# Patient Record
Sex: Male | Born: 1960
Health system: Southern US, Community
[De-identification: ages and names within clinical notes are randomized; demographics above are authoritative.]

## PROBLEM LIST (undated history)

## (undated) DIAGNOSIS — D689 Coagulation defect, unspecified: Secondary | ICD-10-CM

## (undated) DIAGNOSIS — D573 Sickle-cell trait: Secondary | ICD-10-CM

## (undated) DIAGNOSIS — I82409 Acute embolism and thrombosis of unspecified deep veins of unspecified lower extremity: Secondary | ICD-10-CM

## (undated) DIAGNOSIS — E785 Hyperlipidemia, unspecified: Secondary | ICD-10-CM

## (undated) HISTORY — DX: Acute embolism and thrombosis of unspecified deep veins of unspecified lower extremity: I82.409

## (undated) HISTORY — DX: Coagulation defect, unspecified: D68.9

## (undated) HISTORY — PX: COLONOSCOPY: SHX174

## (undated) HISTORY — DX: Hyperlipidemia, unspecified: E78.5

## (undated) HISTORY — DX: Sickle-cell trait: D57.3

## (undated) HISTORY — PX: HERNIA REPAIR: SHX51

## (undated) HISTORY — PX: KNEE SURGERY: SHX244

---

## 2011-02-03 ENCOUNTER — Ambulatory Visit (INDEPENDENT_AMBULATORY_CARE_PROVIDER_SITE_OTHER): Payer: Federal, State, Local not specified - PPO

## 2011-02-03 DIAGNOSIS — M545 Low back pain, unspecified: Secondary | ICD-10-CM

## 2011-02-03 DIAGNOSIS — I10 Essential (primary) hypertension: Secondary | ICD-10-CM

## 2011-03-25 ENCOUNTER — Ambulatory Visit: Payer: Federal, State, Local not specified - PPO

## 2011-03-25 ENCOUNTER — Ambulatory Visit (INDEPENDENT_AMBULATORY_CARE_PROVIDER_SITE_OTHER): Payer: Federal, State, Local not specified - PPO | Admitting: Family Medicine

## 2011-03-25 VITALS — BP 121/74 | HR 73 | Temp 97.4°F | Resp 16 | Ht 70.5 in | Wt 191.6 lb

## 2011-03-25 DIAGNOSIS — IMO0002 Reserved for concepts with insufficient information to code with codable children: Secondary | ICD-10-CM

## 2011-03-25 DIAGNOSIS — M79609 Pain in unspecified limb: Secondary | ICD-10-CM

## 2011-03-25 NOTE — Progress Notes (Signed)
  Patient Name: Ryan Gutierrez Date of Birth: 08/25/1960 Medical Record Number: [id number] Gender: male Date of Encounter: 03/25/2011  History of Present Illness:  Ryan Gutierrez is a 51 y.o. very pleasant male patient who presents with the following:  He has noted a pain in his left posterior thigh for the last month or so.  It hurts when he goes from sitting to standing and when he first starts walking- once he warms it up it feels better- feels bette after just a couple of steps.  Hurts most with prolonged sitting.  It only seems to hurt for a moment or two when he first stands or starts to walk.   He does have a history of DVT/ PE about 10 years ago and has had some swelling of his left leg ever since- this is not changed.  He is compliant with his coumadin and notes no CP/ SOB  There is no problem list on file for this patient.  No past medical history on file. No past surgical history on file. History  Substance Use Topics  . Smoking status: Never Smoker   . Smokeless tobacco: Not on file  . Alcohol Use: Not on file   No family history on file. No Known Allergies  Medication list has been reviewed and updated.  Review of Systems: As per HPI- otherwise negative. On coumadin due to distant history of DVT/PE  Physical Examination: Filed Vitals:   03/25/11 1640  BP: 121/74  Pulse: 73  Temp: 97.4 F (36.3 C)  TempSrc: Oral  Resp: 16  Height: 5' 10.5" (1.791 m)  Weight: 191 lb 9.6 oz (86.909 kg)    Body mass index is 27.10 kg/(m^2).   GEN: WDWN, NAD, Non-toxic, Alert & Oriented x 3 HEENT: Atraumatic, Normocephalic.  Ears and Nose: No external deformity. EXTR: No clubbing/cyanosis/edema NEURO: Normal gait.  PSYCH: Normally interactive. Conversant. Not depressed or anxious appearing.  Calm demeanor.  He notes that his left hamstring is the source of his sympotms. However, it is not hurting right now.  He has normal hip movement and ROM, normal back flexion.  No  swelling, tenderness or other problems with his hamstring that I can see at this time  [facility] reading (PRIMARY) by  Dr. Patsy Lager.  Negative femur.  ?calcified vessel in leg  Assessment and Plan: 1. Leg strain  DG Femur Left   I think Mynor has a hamstring strain- encouraged him to do stretches.  I do not think medications will be helpful as the discomfort only lasts for a moment.  I will await the overread report regarding the calcification in his leg- may need an ultrasound

## 2011-03-28 ENCOUNTER — Other Ambulatory Visit: Payer: Self-pay | Admitting: Family Medicine

## 2011-03-28 DIAGNOSIS — M79606 Pain in leg, unspecified: Secondary | ICD-10-CM

## 2011-06-20 LAB — POCT INR: INR: 2.3 — AB (ref <=1.1)

## 2011-10-20 ENCOUNTER — Ambulatory Visit: Payer: Federal, State, Local not specified - PPO

## 2011-10-20 ENCOUNTER — Ambulatory Visit (INDEPENDENT_AMBULATORY_CARE_PROVIDER_SITE_OTHER): Payer: Federal, State, Local not specified - PPO | Admitting: Family Medicine

## 2011-10-20 VITALS — BP 112/70 | HR 67 | Temp 97.7°F | Resp 16 | Ht 70.5 in | Wt 190.0 lb

## 2011-10-20 DIAGNOSIS — K5900 Constipation, unspecified: Secondary | ICD-10-CM

## 2011-10-20 DIAGNOSIS — Z0000 Encounter for general adult medical examination without abnormal findings: Secondary | ICD-10-CM

## 2011-10-20 DIAGNOSIS — K921 Melena: Secondary | ICD-10-CM

## 2011-10-20 LAB — POCT URINALYSIS DIPSTICK
Bilirubin, UA: NEGATIVE
Blood, UA: NEGATIVE
Glucose, UA: NEGATIVE
Ketones, UA: NEGATIVE
Leukocytes, UA: NEGATIVE
Nitrite, UA: NEGATIVE
Protein, UA: NEGATIVE
Spec Grav, UA: 1.015
Urobilinogen, UA: 0.2
pH, UA: 6

## 2011-10-20 LAB — LIPID PANEL
Cholesterol: 166 mg/dL (ref 0–200)
HDL: 39 mg/dL — ABNORMAL LOW (ref >39)
LDL Cholesterol: 107 mg/dL — ABNORMAL HIGH (ref 0–99)
Total CHOL/HDL Ratio: 4.3 Ratio
Triglycerides: 99 mg/dL (ref <150)
VLDL: 20 mg/dL (ref 0–40)

## 2011-10-20 LAB — COMPREHENSIVE METABOLIC PANEL
ALT: 15 U/L (ref 0–53)
AST: 20 U/L (ref 0–37)
Albumin: 4.1 g/dL (ref 3.5–5.2)
Alkaline Phosphatase: 75 U/L (ref 39–117)
BUN: 15 mg/dL (ref 6–23)
CO2: 32 mEq/L (ref 19–32)
Calcium: 9.3 mg/dL (ref 8.4–10.5)
Chloride: 104 mEq/L (ref 96–112)
Creat: 0.98 mg/dL (ref 0.50–1.35)
Glucose, Bld: 89 mg/dL (ref 70–99)
Potassium: 4.4 mEq/L (ref 3.5–5.3)
Sodium: 142 mEq/L (ref 135–145)
Total Bilirubin: 0.6 mg/dL (ref 0.3–1.2)
Total Protein: 7.7 g/dL (ref 6.0–8.3)

## 2011-10-20 LAB — POCT CBC
Granulocyte percent: 69.4 %G (ref 37–80)
HCT, POC: 48.3 % (ref 43.5–53.7)
Hemoglobin: 15 g/dL (ref 14.1–18.1)
Lymph, poc: 1.5 (ref 0.6–3.4)
MCH, POC: 27.9 pg (ref 27–31.2)
MCHC: 31.1 g/dL — AB (ref 31.8–35.4)
MCV: 90 fL (ref 80–97)
MID (cbc): 0.4 (ref 0–0.9)
MPV: 10.2 fL (ref 0–99.8)
POC Granulocyte: 4.4 (ref 2–6.9)
POC LYMPH PERCENT: 23.8 %L (ref 10–50)
POC MID %: 6.8 %M (ref 0–12)
Platelet Count, POC: 201 10*3/uL (ref 142–424)
RBC: 5.37 M/uL (ref 4.69–6.13)
RDW, POC: 14.8 %
WBC: 6.3 10*3/uL (ref 4.6–10.2)

## 2011-10-20 LAB — IFOBT (OCCULT BLOOD): IFOBT: NEGATIVE

## 2011-10-20 LAB — PSA: PSA: 1.48 ng/mL (ref <=4.00)

## 2011-10-20 NOTE — Progress Notes (Signed)
@[email or web address] @  Patient ID: Ryan Gutierrez MRN: [id number], DOB: [birth year] 51 y.o. Date of Encounter: 10/20/2011, 8:52 AM  Primary Physician: No primary provider on file.  Chief Complaint: Physical (CPE)  HPI: 51 y.o. y/o male with history noted below here for CPE.  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 He does ago for medical for this.  The last 6 months he's had some problems with constipation and blood in his stool.  Review of Systems: Consitutional: No fever, chills, fatigue, night sweats, lymphadenopathy, or weight changes. Eyes: No visual changes, eye redness, or discharge. ENT/Mouth: Ears: No otalgia, tinnitus, hearing loss, discharge. Nose: No congestion, rhinorrhea, sinus pain, or epistaxis. Throat: No sore throat, post nasal drip, or teeth pain. Cardiovascular: No CP, palpitations, diaphoresis, DOE, edema, orthopnea, PND. Respiratory: No cough, hemoptysis, SOB, or wheezing. Gastrointestinal: No anorexia, dysphagia, reflux, pain, nausea, vomiting, hematemesis, diarrhea, or melena. Genitourinary: No dysuria, frequency, urgency, hematuria, incontinence, nocturia, decreased urinary stream, discharge, impotence, or testicular pain/masses. Musculoskeletal: No decreased ROM, myalgias, stiffness, joint swelling, or weakness. Skin: No rash, erythema, lesion changes, pain, warmth, jaundice, or pruritis. Neurological: No headache, dizziness, syncope, seizures, tremors, memory loss, coordination problems, or paresthesias. Psychological: No anxiety, depression, hallucinations, SI/HI. Endocrine: No fatigue, polydipsia, polyphagia, polyuria, or known diabetes. All other systems were reviewed and are otherwise negative.  Past Medical History  Diagnosis Date  . Clotting disorder   . Hyperlipidemia      Past Surgical History  Procedure Date  . Hernia repair     Home Meds:  Prior to Admission medications     Medication Sig Start Date End Date Taking? Authorizing Provider  pravastatin (PRAVACHOL) 40 MG tablet Take 40 mg by mouth daily.   Yes Historical Provider, MD  warfarin (COUMADIN) 5 MG tablet Take 5 mg by mouth daily. Takes 5 mg Mon, Tues Thurs, Fri Sat. Takes 7.5 on Wed & Sun   Yes Historical Provider, MD    Allergies: No Known Allergies  History   Social History  . Marital Status: Single    Spouse Name: N/A    Number of Children: N/A  . Years of Education: N/A   Occupational History  . Not on file.   Social History Main Topics  . Smoking status: Never Smoker   . Smokeless tobacco: Not on file  . Alcohol Use: Not on file  . Drug Use: Not on file  . Sexually Active: Not on file   Other Topics Concern  . Not on file   Social History Narrative  . No narrative on file    Family History  Problem Relation Age of Onset  . Heart disease Mother     Physical Exam: Blood pressure 112/70, pulse 67, temperature 97.7 F (36.5 C), temperature source Oral, resp. rate 16, height 5' 10.5" (1.791 m), weight 190 lb (86.183 kg), SpO2 97.00%.  General: Well developed, well nourished, in no acute distress. HEENT: Normocephalic, atraumatic. Conjunctiva pink, sclera non-icteric. Pupils 2 mm constricting to 1 mm, round, regular, and equally reactive to light and accomodation. EOMI. Internal auditory canal clear. TMs with good cone of light and without pathology. Nasal mucosa pink. Nares are without discharge. No sinus tenderness. Oral mucosa pink. Dentition very poor. Pharynx without exudate.   Neck: Supple. Trachea midline. No thyromegaly. Full ROM. No lymphadenopathy. Lungs: Clear to auscultation bilaterally without wheezes, rales, or rhonchi. Breathing is of normal effort and unlabored. Cardiovascular: RRR with S1  S2. No murmurs, rubs, or gallops appreciated. Distal pulses 2+ symmetrically. No carotid or abdominal bruits Abdomen: Soft, non-tender, non-distended with normoactive bowel sounds.  No hepatosplenomegaly or masses. No rebound/guarding. No CVA tenderness. Without hernias.  Rectal: No external hemorrhoids or fissures. Rectal vault without masses.  Genitourinary:  circumcised male. No penile lesions. Testes descended bilaterally, and smooth without tenderness or masses.  Musculoskeletal: Full range of motion and 5/5 strength throughout. Without swelling, atrophy, tenderness, crepitus, or warmth. Extremities without clubbing, cyanosis, or edema. Calves supple. Skin: Warm and moist without erythema, ecchymosis, wounds, or rash. Neuro: A+Ox3. CN II-XII grossly intact. Moves all extremities spontaneously. Full sensation throughout. Normal gait. DTR 2+ throughout upper and lower extremities. Finger to nose intact. Psych:  Responds to questions appropriately with a normal affect.   Studies: CBC, CMET, Lipid, PSA, TSH all pending. Patient is  UA:   Assessment/Plan:  51 y.o. y/o  male here for CPE -[facility] reading (PRIMARY) by  Dr. Milus Glazier normal CXR Results for orders placed in visit on 10/20/11  POCT CBC      Component Value Range   WBC 6.3  4.6 - 10.2 K/uL   Lymph, poc 1.5  0.6 - 3.4   POC LYMPH PERCENT 23.8  10 - 50 %L   MID (cbc) 0.4  0 - 0.9   POC MID % 6.8  0 - 12 %M   POC Granulocyte 4.4  2 - 6.9   Granulocyte percent 69.4  37 - 80 %G   RBC 5.37  4.69 - 6.13 M/uL   Hemoglobin 15.0  14.1 - 18.1 g/dL   HCT, POC 16.1  09.6 - 53.7 %   MCV 90.0  80 - 97 fL   MCH, POC 27.9  27 - 31.2 pg   MCHC 31.1 (*) 31.8 - 35.4 g/dL   RDW, POC 04.5     Platelet Count, POC 201  142 - 424 K/uL   MPV 10.2  0 - 99.8 fL  IFOBT (OCCULT BLOOD)      Component Value Range   IFOBT Negative    POCT URINALYSIS DIPSTICK      Component Value Range   Color, UA yellow     Clarity, UA clear     Glucose, UA neg     Bilirubin, UA neg     Ketones, UA neg     Spec Grav, UA 1.015     Blood, UA neg     pH, UA 6.0     Protein, UA neg     Urobilinogen, UA 0.2     Nitrite, UA neg      Leukocytes, UA Negative     1. Health care maintenance  Ambulatory referral to Gastroenterology, POCT CBC, IFOBT POC (occult bld, rslt in office), Comprehensive metabolic panel, Lipid panel, PSA, DG Chest 2 View, EKG 12-Lead, POCT urinalysis dipstick  2. Hematochezia  Ambulatory referral to Gastroenterology, POCT CBC, IFOBT POC (occult bld, rslt in office), Comprehensive metabolic panel, Lipid panel, PSA, DG Chest 2 View, EKG 12-Lead  3. Constipation  Ambulatory referral to Gastroenterology, POCT CBC, IFOBT POC (occult bld, rslt in office), Comprehensive metabolic panel, Lipid panel, PSA, DG Chest 2 View, EKG 12-Lead   .    Signed, Elvina Sidle, MD 10/20/2011 8:52 AM

## 2011-10-24 ENCOUNTER — Encounter: Payer: Self-pay | Admitting: *Deleted

## 2011-10-24 DIAGNOSIS — I82409 Acute embolism and thrombosis of unspecified deep veins of unspecified lower extremity: Secondary | ICD-10-CM | POA: Insufficient documentation

## 2011-10-27 ENCOUNTER — Other Ambulatory Visit: Payer: Self-pay | Admitting: Family Medicine

## 2011-11-05 ENCOUNTER — Encounter: Payer: Self-pay | Admitting: *Deleted

## 2011-11-05 DIAGNOSIS — K921 Melena: Secondary | ICD-10-CM | POA: Insufficient documentation

## 2012-01-26 ENCOUNTER — Other Ambulatory Visit: Payer: Self-pay | Admitting: Physician Assistant

## 2012-04-27 ENCOUNTER — Other Ambulatory Visit: Payer: Self-pay | Admitting: Physician Assistant

## 2012-05-27 ENCOUNTER — Other Ambulatory Visit: Payer: Self-pay | Admitting: Physician Assistant

## 2012-06-24 ENCOUNTER — Other Ambulatory Visit: Payer: Self-pay | Admitting: Family Medicine

## 2012-07-23 ENCOUNTER — Other Ambulatory Visit: Payer: Self-pay | Admitting: Physician Assistant

## 2013-02-15 ENCOUNTER — Encounter: Payer: Self-pay | Admitting: Family Medicine

## 2013-03-23 ENCOUNTER — Encounter: Payer: Self-pay | Admitting: Family Medicine

## 2013-05-26 ENCOUNTER — Ambulatory Visit (INDEPENDENT_AMBULATORY_CARE_PROVIDER_SITE_OTHER): Payer: Federal, State, Local not specified - PPO | Admitting: Emergency Medicine

## 2013-05-26 VITALS — BP 108/64 | HR 59 | Temp 97.6°F | Resp 14 | Ht 70.5 in | Wt 189.8 lb

## 2013-05-26 DIAGNOSIS — E782 Mixed hyperlipidemia: Secondary | ICD-10-CM

## 2013-05-26 DIAGNOSIS — Z0000 Encounter for general adult medical examination without abnormal findings: Secondary | ICD-10-CM

## 2013-05-26 DIAGNOSIS — Z7901 Long term (current) use of anticoagulants: Secondary | ICD-10-CM

## 2013-05-26 DIAGNOSIS — I2699 Other pulmonary embolism without acute cor pulmonale: Secondary | ICD-10-CM

## 2013-05-26 LAB — LIPID PANEL
Cholesterol: 267 mg/dL — ABNORMAL HIGH (ref 0–200)
HDL: 42 mg/dL (ref >39)
LDL CALC: 203 mg/dL — AB (ref 0–99)
Total CHOL/HDL Ratio: 6.4 Ratio
Triglycerides: 110 mg/dL (ref <150)
VLDL: 22 mg/dL (ref 0–40)

## 2013-05-26 LAB — POCT UA - MICROSCOPIC ONLY
CRYSTALS, UR, HPF, POC: NEGATIVE
Casts, Ur, LPF, POC: NEGATIVE
Mucus, UA: NEGATIVE
YEAST UA: NEGATIVE

## 2013-05-26 LAB — POCT URINALYSIS DIPSTICK
Bilirubin, UA: NEGATIVE
GLUCOSE UA: NEGATIVE
Ketones, UA: NEGATIVE
NITRITE UA: NEGATIVE
Protein, UA: NEGATIVE
Spec Grav, UA: 1.02
Urobilinogen, UA: 1
pH, UA: 7

## 2013-05-26 LAB — POCT CBC
Granulocyte percent: 66.1 %G (ref 37–80)
HCT, POC: 48.1 % (ref 43.5–53.7)
Hemoglobin: 15.1 g/dL (ref 14.1–18.1)
Lymph, poc: 1.4 (ref 0.6–3.4)
MCH: 28 pg (ref 27–31.2)
MCHC: 31.4 g/dL — AB (ref 31.8–35.4)
MCV: 89.3 fL (ref 80–97)
MID (CBC): 0.4 (ref 0–0.9)
MPV: 11.2 fL (ref 0–99.8)
PLATELET COUNT, POC: 203 10*3/uL (ref 142–424)
POC Granulocyte: 3.4 (ref 2–6.9)
POC LYMPH %: 27.1 % (ref 10–50)
POC MID %: 6.8 % (ref 0–12)
RBC: 5.39 M/uL (ref 4.69–6.13)
RDW, POC: 14.7 %
WBC: 5.2 10*3/uL (ref 4.6–10.2)

## 2013-05-26 LAB — COMPREHENSIVE METABOLIC PANEL
ALBUMIN: 4.6 g/dL (ref 3.5–5.2)
ALK PHOS: 93 U/L (ref 39–117)
ALT: 16 U/L (ref 0–53)
AST: 21 U/L (ref 0–37)
BUN: 16 mg/dL (ref 6–23)
CO2: 29 mEq/L (ref 19–32)
Calcium: 9.5 mg/dL (ref 8.4–10.5)
Chloride: 101 mEq/L (ref 96–112)
Creat: 1.01 mg/dL (ref 0.50–1.35)
Glucose, Bld: 95 mg/dL (ref 70–99)
POTASSIUM: 4.3 meq/L (ref 3.5–5.3)
SODIUM: 139 meq/L (ref 135–145)
TOTAL PROTEIN: 8.3 g/dL (ref 6.0–8.3)
Total Bilirubin: 0.6 mg/dL (ref 0.2–1.2)

## 2013-05-26 NOTE — Patient Instructions (Signed)
Warfarin: What You Need to Know  Warfarin is an anticoagulant. Anticoagulants help prevent the formation of blood clots. They also help stop the growth of blood clots. Warfarin is sometimes referred to as a "blood thinner."   Normally, when body tissues are cut or damaged, the blood clots in order to prevent blood loss. Sometimes clots form inside your blood vessels and obstruct the flow of blood through your circulatory system (thrombosis). These clots may travel through your bloodstream and become lodged in smaller blood vessels in your brain, which can cause a stroke, or your lungs (pulmonary embolism).  WHO SHOULD USE WARFARIN?  Warfarin is prescribed for people at risk of developing harmful blood clots:  · People with surgically implanted mechanical heart valves, irregular heart rhythms called atrial fibrillation, and certain clotting disorders.  · People who have developed harmful blood clotting in the past, including those who have had a stroke or a pulmonary embolism, or thrombosis in their legs (deep vein thrombosis [DVT]).  · People with an existing blood clot such as a pulmonary embolism.  WARFARIN DOSING  Warfarin tablets come in different strengths. Each tablet strength is a different color, with the amount of warfarin (in milligrams) clearly printed on the tablet. If the color of your tablet is different than usual when you receive a new prescription, report it immediately to your pharmacist or health care provider.  WARFARIN MONITORING  The goal of warfarin therapy is to lessen the clotting tendency of blood but not to prevent clotting completely. Your health care provider will monitor the anticoagulation effect of warfarin closely and adjust your dose as needed. For your safety, blood tests called prothrombin time (PT) or international normalized ratio (INR) are used to measure the effects of warfarin. Both of these tests can be done with a finger stick or a blood draw.  The longer it takes the blood  to clot, the higher the PT or INR. Your health care provider will inform you of your "target" PT or INR range. If, at any time, your PT or INR is above the target range, there is a risk of bleeding. If your PT or INR is below the target range, there is a risk of clotting.  Whether you are started on warfarin while you are in the hospital, or in your health care provider's office, you will need to have your PT or INR checked within one week of starting the medicine. Initially, some people are asked to have their PT or INR checked as much as twice a week.  Once you are on a stable maintenance dose, the PT or INR is checked less often, usually once every 2 to 4 weeks. The warfarin dose may be adjusted if the PT or INR is not within the target range. It is important to keep all laboratory and health care provider follow-up appointments.   WHAT ARE THE SIDE EFFECTS OF WARFARIN?  · Too much warfarin can cause bleeding (hemorrhage) from any part of the body. This may include bleeding from the gums, blood in the urine, bloody or dark stools, a nosebleed that is not easily stopped, coughing up blood, or vomiting blood.  · Too little warfarin can increase the risk of blood clots.  · Too little or too much warfarin can also increase the risk of a stroke.  · Warfarin use may cause a skin rash or irritation, an unusual fever, continual nausea or stomach upset, or severe pain in your joints or back.  SPECIAL PRECAUTIONS   WHILE TAKING WARFARIN  Warfarin should be taken exactly as directed:  · Take your medicine at the same time every day. If you forget to take your dose, you can take it if it is within 6 hours of when it was due.  · Do not change the dose of warfarin on your own to make up for missed or extra doses.  · If you miss more than 2 doses in a row, you should contact your health care provider for advice.  Avoid situations that cause bleeding. You may have a tendency to bleed more easily than usual while taking warfarin.  The following actions can limit bleeding:  · Using a softer toothbrush.  · Flossing with waxed floss rather than unwaxed floss.  · Shaving with an electric razor rather than a blade.  · Limiting the use of sharp objects.  · Avoiding potentially harmful activities such as contact sports.  Warfarin and Pregnancy or Breastfeeding  · Warfarin is not advised during the first trimester of pregnancy due to an increased risk of birth defects. In certain situations, a woman may take warfarin after her first trimester of pregnancy. A woman who becomes pregnant or plans to become pregnant while taking warfarin should notify her health care provider immediately.  · Although warfarin does not pass into breast milk, a woman who wishes to breastfeed while taking warfarin should also consult with her health care provider.  Alcohol, Smoking, and Illicit Drug Use  · Alcohol affects how warfarin works in the body. It is best to avoid alcoholic drinks or consume very small amounts while taking warfarin. In general, alcohol intake should be limited to 1 oz (30 mL) of liquor, 6 oz (180 mL) of wine, or 12 oz (360 mL) of beer each day. Notify your health care provider if you change your alcohol intake.  · Smoking affects how warfarin works. It is best to avoid smoking while taking warfarin. Notify your health care provider if you change your smoking habits.  · It is best to avoid all illicit drugs while taking warfarin since there are few studies that show how warfarin interacts with these drugs.  Other Medicines and Dietary Supplements  Many prescription and over-the-counter medicines can interfere with warfarin. Be sure all of your health care providers know you are taking warfarin. Notify your health care provider who prescribed warfarin for you before starting or stopping any new medicines, including over-the-counter vitamins, dietary supplements, and pain medicines. Your warfarin dose may need to be adjusted.  Some common  over-the-counter medicines that may increase the risk of bleeding while taking warfarin include:   · Acetaminophen.  · Aspirin.  · Nonsteroidal anti-inflammatory medicines such as ibuprofen or naproxen.  · Vitamin E.  Dietary Considerations   Foods that have moderate or high amounts of vitamin K can interfere with warfarin. Avoid major changes in your diet or notify your health care provider before changing your diet. Eat a consistent amount of foods that have moderate or high amounts of vitamin K.Eating less foods containing vitamin K can increase the risk of bleeding. Eating more foods containing vitamin K can increase the risk of blood clots. Additional questions about dietary considerations can be discussed with a dietitian.  The serving size for foods containing moderate or high amounts of vitamin K are ½ cup cooked (120 mL or noted gram weight) or 1 cup raw (240 mL or noted gram weight), unless otherwise noted. These foods include:  Proteins  · Beef liver,   3.5 oz (100 g).  · Pork liver, 3.5 oz (100 g).  Legumes  · Soybean oil.  · Soybeans.  · Garbanzo beans.  · Green peas.  · Black-eyed peas.  Leafy green vegetables  · Kale.  · Spinach.  · Nettle greens.  · Swiss chard.  · Watercress.  · Endive.  · Parsley, 1 tbsp (4 g).  · Turnip greens.  · Collard greens.  · Seaweed, limit 2 sheets.  · Beet greens.  · Dandelion greens.  · Mustard greens.  · Green Lead and Romaine lettuce.  Cruciferous vegetables  · Broccoli.  · Cabbage (green or Chinese).  · Brussels sprouts.  · Cauliflower.  · Asparagus.  Miscellaneous  · Onions, green onions, or spring onions.  · Green tea made with ½ oz (14 g) or more of dried tea.  · Herbal teas containing coumarin.  · Spinach noodles.  · Okra.  · Prunes.  · Pickles.  CALL YOUR CLINIC OR HEALTH CARE PROVIDER IF YOU:  · Plan to have any surgery or procedure.  · Feel sick, especially if you have diarrhea or vomiting.  · Experience or anticipate any major changes in your diet.  · Start or  stop a prescription or over-the-counter medicine.  · Become, plan to become, or think you may be pregnant.  · Are having heavier than usual menstrual periods.  · Have had a fall, accident, or any symptoms of bleeding or unusual bruising.  · An unusual fever.  CALL 911 IN THE [place] OR GO TO THE EMERGENCY DEPARTMENT IF YOU:   · Think you may be having an allergic reaction to warfarin. The signs of an allergic reaction could include itching, rash, hives, swelling, chest tightness, or trouble breathing.  · See signs of blood in your urine. The signs could include reddish, pinkish, or tea-colored urine.  · See signs of blood in your stools. The signs could include bright red or black stools.  · Vomit or cough up blood. In these instances, the blood could have either a bright red or a "coffee-grounds" appearance.  · Have bleeding that will not stop after applying pressure for 30 minutes such as cuts, nosebleeds, other injuries.  · Have severe pain in your joints or back.  · Have a new and severe headache.  · Have sudden weakness or numbness of your face, arm, or leg, especially on one side of your body.  · Have sudden confusion or trouble understanding.  · Have sudden trouble seeing in one or both eyes.  · Have sudden trouble walking, dizziness, loss of balance, or coordination.  · Have aphasia.  Document Released: 12/23/2004 Document Revised: 09/17/2011 Document Reviewed: 06/18/2012  Exit[employer]® Patient Information ©2014 [employer].

## 2013-05-26 NOTE — Progress Notes (Signed)
[facility] [address], [place] [address] [phone]  Date:  05/26/2013   Name:  Ryan Gutierrez   DOB:  March 20, 1960   MRN:  [id number]  PCP:  No PCP Per Patient    Chief Complaint: Annual Exam   History of Present Illness:  Ryan Saanthony Strickling is a 53 y.o. very pleasant male patient who presents with the following:  For annual physical.  Has been out of his pravachol.  Has been taking coumadin for one year without lab or medical supervision.  Denies any bleeding but bruises from time to time.  Underwent colonoscopy in January this year.  No current medical complaints.    Patient Active Problem List   Diagnosis Date Noted  . Mixed hyperlipidemia 05/26/2013  . Hematochezia 11/05/2011  . Recurrent deep vein thrombosis 10/24/2011    Past Medical History  Diagnosis Date  . Clotting disorder   . Hyperlipidemia     Past Surgical History  Procedure Laterality Date  . Hernia repair      History  Substance Use Topics  . Smoking status: Never Smoker   . Smokeless tobacco: Not on file  . Alcohol Use: Not on file    Family History  Problem Relation Age of Onset  . Heart disease Mother     No Known Allergies  Medication list has been reviewed and updated.  Current Outpatient Prescriptions on File Prior to Visit  Medication Sig Dispense Refill  . warfarin (COUMADIN) 5 MG tablet Take 5 mg by mouth daily. Takes 5 mg Sun, Mon, Tues Thurs, Fri, Sat. Takes 7.5 on Wed      . pravastatin (PRAVACHOL) 40 MG tablet TAKE 1 TABLET BY MOUTH EVERY DAY  30 tablet  0   No current facility-administered medications on file prior to visit.    Review of Systems:  As per HPI, otherwise negative.    Physical Examination: Filed Vitals:   05/26/13 1040  BP: 108/64  Pulse: 59  Temp: 97.6 F (36.4 C)  Resp: 14   Filed Vitals:   05/26/13 1040  Height: 5' 10.5" (1.791 m)  Weight: 189 lb 12.8 oz (86.093 kg)   Body mass index is 26.84 kg/(m^2). Ideal Body  Weight: Weight in (lb) to have BMI = 25: 176.4  GEN: WDWN, NAD, Non-toxic, A & O x 3 HEENT: Atraumatic, Normocephalic. Neck supple. No masses, No LAD. Ears and Nose: No external deformity. CV: RRR, No M/G/R. No JVD. No thrill. No extra heart sounds. PULM: CTA B, no wheezes, crackles, rhonchi. No retractions. No resp. distress. No accessory muscle use. ABD: S, NT, ND, +BS. No rebound. No HSM. EXTR: No c/c/e NEURO Normal gait.  PSYCH: Normally interactive. Conversant. Not depressed or anxious appearing.  Calm demeanor.    Assessment and Plan: Wellness examination Noncompliance Follow up on labs   Signed,  Phillips OdorJeffery Khris Jansson, MD   Results for orders placed in visit on 05/26/13  POCT CBC      Result Value Ref Range   WBC 5.2  4.6 - 10.2 K/uL   Lymph, poc 1.4  0.6 - 3.4   POC LYMPH PERCENT 27.1  10 - 50 %L   MID (cbc) 0.4  0 - 0.9   POC MID % 6.8  0 - 12 %M   POC Granulocyte 3.4  2 - 6.9   Granulocyte percent 66.1  37 - 80 %G   RBC 5.39  4.69 - 6.13 M/uL   Hemoglobin 15.1  14.1 -  18.1 g/dL   HCT, POC 04.548.1  40.943.5 - 53.7 %   MCV 89.3  80 - 97 fL   MCH, POC 28.0  27 - 31.2 pg   MCHC 31.4 (*) 31.8 - 35.4 g/dL   RDW, POC 81.114.7     Platelet Count, POC 203  142 - 424 K/uL   MPV 11.2  0 - 99.8 fL  POCT UA - MICROSCOPIC ONLY      Result Value Ref Range   WBC, Ur, HPF, POC 1-6     RBC, urine, microscopic 0-2     Bacteria, U Microscopic trace     Mucus, UA neg     Epithelial cells, urine per micros 0-3     Crystals, Ur, HPF, POC neg     Casts, Ur, LPF, POC neg     Yeast, UA neg    POCT URINALYSIS DIPSTICK      Result Value Ref Range   Color, UA yellow     Clarity, UA clear     Glucose, UA neg     Bilirubin, UA neg     Ketones, UA neg     Spec Grav, UA 1.020     Blood, UA trace     pH, UA 7.0     Protein, UA neg     Urobilinogen, UA 1.0     Nitrite, UA neg     Leukocytes, UA small (1+)

## 2013-05-27 LAB — PSA: PSA: 1.38 ng/mL (ref <=4.00)

## 2013-05-27 LAB — PROTIME-INR
INR: 1.95 — AB (ref <1.50)
Prothrombin Time: 21.8 seconds — ABNORMAL HIGH (ref 11.6–15.2)

## 2013-05-29 ENCOUNTER — Other Ambulatory Visit: Payer: Self-pay | Admitting: Emergency Medicine

## 2013-05-29 MED ORDER — PRAVASTATIN SODIUM 40 MG PO TABS: ORAL_TABLET | ORAL | Status: DC

## 2013-05-29 MED ORDER — ATORVASTATIN CALCIUM 40 MG PO TABS: 40.0000 mg | ORAL_TABLET | Freq: Every day | ORAL | Status: DC

## 2013-05-29 NOTE — Addendum Note (Signed)
Addended by: Carmelina Dane on: 05/29/2013 02:37 PM   Modules accepted: Orders, Medications

## 2013-05-29 NOTE — Addendum Note (Signed)
Addended by: Carmelina Dane on: 05/29/2013 02:28 PM   Modules accepted: Orders

## 2013-06-09 ENCOUNTER — Telehealth: Payer: Self-pay

## 2013-06-09 NOTE — Telephone Encounter (Signed)
Dr. Dareen Piano, we faxed a form to the place below and they need to know how long before his procedure on the 10th that he should stop the coumadin. Thanks   Magazine features editor at [place] [facility] [employer]  [phone]

## 2013-06-10 NOTE — Telephone Encounter (Signed)
I recommend a week

## 2013-06-11 NOTE — Telephone Encounter (Signed)
Spoke with pt. He says that he is going to stop it today and keep his appt on the 10th. Says that he is "just having a tooth pulled" and that he's had this done before with no problems from his coumadin.

## 2013-06-24 ENCOUNTER — Institutional Professional Consult (permissible substitution): Payer: Self-pay | Admitting: Cardiology

## 2013-07-12 ENCOUNTER — Ambulatory Visit (INDEPENDENT_AMBULATORY_CARE_PROVIDER_SITE_OTHER): Payer: Federal, State, Local not specified - PPO | Admitting: Internal Medicine

## 2013-07-12 ENCOUNTER — Ambulatory Visit (INDEPENDENT_AMBULATORY_CARE_PROVIDER_SITE_OTHER): Payer: Federal, State, Local not specified - PPO | Admitting: *Deleted

## 2013-07-12 ENCOUNTER — Encounter: Payer: Self-pay | Admitting: Internal Medicine

## 2013-07-12 VITALS — BP 130/84 | Ht 70.5 in | Wt 194.9 lb

## 2013-07-12 DIAGNOSIS — E782 Mixed hyperlipidemia: Secondary | ICD-10-CM

## 2013-07-12 DIAGNOSIS — Z7901 Long term (current) use of anticoagulants: Secondary | ICD-10-CM

## 2013-07-12 DIAGNOSIS — I2699 Other pulmonary embolism without acute cor pulmonale: Secondary | ICD-10-CM

## 2013-07-12 DIAGNOSIS — I82409 Acute embolism and thrombosis of unspecified deep veins of unspecified lower extremity: Secondary | ICD-10-CM

## 2013-07-12 LAB — POCT INR: INR: 2

## 2013-07-12 MED ORDER — RIVAROXABAN 20 MG PO TABS: 20.0000 mg | ORAL_TABLET | Freq: Every day | ORAL | Status: DC
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
## 2013-07-12 NOTE — Patient Instructions (Addendum)
STOP COUMADIN TODAY START Xarelto 20mg  once daily - take with supper.  ** Start this tomorrow.   Your physician wants you to follow-up in: 1 year with Dr. Rennis GoldenHilty. You will receive a reminder letter in the mail two months in advance. If you don't receive a letter, please call our office to schedule the follow-up appointment.

## 2013-11-28 ENCOUNTER — Telehealth: Payer: Self-pay

## 2013-11-28 NOTE — Telephone Encounter (Signed)
Patient needs refill of cholesterol medication. He says we cannot come in until Wednesday at the earliest

## 2013-11-29 ENCOUNTER — Other Ambulatory Visit: Payer: Self-pay | Admitting: Physician Assistant

## 2013-11-29 MED ORDER — ATORVASTATIN CALCIUM 40 MG PO TABS: 40.0000 mg | ORAL_TABLET | Freq: Every day | ORAL | Status: DC

## 2013-11-29 NOTE — Telephone Encounter (Signed)
Refill sent to the pharmacy.  Lm to advise pt.

## 2013-11-29 NOTE — Telephone Encounter (Signed)
Patient called back to return a call. Please advise.

## 2013-12-29 ENCOUNTER — Ambulatory Visit (INDEPENDENT_AMBULATORY_CARE_PROVIDER_SITE_OTHER): Payer: Federal, State, Local not specified - PPO | Admitting: Emergency Medicine

## 2013-12-29 VITALS — BP 120/72 | HR 60 | Temp 97.8°F | Resp 18 | Ht 71.0 in | Wt 205.0 lb

## 2013-12-29 DIAGNOSIS — E782 Mixed hyperlipidemia: Secondary | ICD-10-CM

## 2013-12-29 LAB — COMPREHENSIVE METABOLIC PANEL
ALBUMIN: 3.9 g/dL (ref 3.5–5.2)
ALK PHOS: 113 U/L (ref 39–117)
ALT: 20 U/L (ref 0–53)
AST: 21 U/L (ref 0–37)
BUN: 11 mg/dL (ref 6–23)
CO2: 27 mEq/L (ref 19–32)
CREATININE: 1 mg/dL (ref 0.50–1.35)
Calcium: 9.1 mg/dL (ref 8.4–10.5)
Chloride: 104 mEq/L (ref 96–112)
Glucose, Bld: 92 mg/dL (ref 70–99)
POTASSIUM: 4.2 meq/L (ref 3.5–5.3)
Sodium: 139 mEq/L (ref 135–145)
Total Bilirubin: 0.5 mg/dL (ref 0.2–1.2)
Total Protein: 7.2 g/dL (ref 6.0–8.3)

## 2013-12-29 LAB — LIPID PANEL
CHOL/HDL RATIO: 3 ratio
CHOLESTEROL: 116 mg/dL (ref 0–200)
HDL: 39 mg/dL — ABNORMAL LOW (ref >39)
LDL Cholesterol: 65 mg/dL (ref 0–99)
TRIGLYCERIDES: 61 mg/dL (ref <150)
VLDL: 12 mg/dL (ref 0–40)

## 2013-12-29 NOTE — Patient Instructions (Signed)

## 2013-12-29 NOTE — Progress Notes (Signed)
[facility] [address], [place] [address] [phone]  Date:  12/29/2013   Name:  Ryan Gutierrez   DOB:  [birth year]   MRN:  [id number]  PCP:  No PCP Per Patient    Chief Complaint: Hyperlipidemia   History of Present Illness:  Ryan Gutierrez is a 53 y.o. very pleasant male patient who presents with the following:  Started on xarelto for DVT due non compliance with coumadin Now here for labs to check lipids. No adverse symptoms related to medications. Denies other complaint or health concern today.   Patient Active Problem List   Diagnosis Date Noted  . Mixed hyperlipidemia 05/26/2013  . Other pulmonary embolism and infarction 05/26/2013  . Long term (current) use of anticoagulants 05/26/2013  . Hematochezia 11/05/2011  . Recurrent deep vein thrombosis 10/24/2011    Past Medical History  Diagnosis Date  . Clotting disorder   . Hyperlipidemia   . DVT (deep venous thrombosis)     post-knee surgery  . Sickle cell trait     Past Surgical History  Procedure Laterality Date  . Hernia repair    . Knee surgery  1990s    History  Substance Use Topics  . Smoking status: Never Smoker   . Smokeless tobacco: Never Used  . Alcohol Use: No    Family History  Problem Relation Age of Onset  . Heart disease Mother   . Heart attack Mother   . Diabetes Sister   . Hypertension Sister   . Sickle cell anemia Son     No Known Allergies  Medication list has been reviewed and updated.  Current Outpatient Prescriptions on File Prior to Visit  Medication Sig Dispense Refill  . atorvastatin (LIPITOR) 40 MG tablet Take 1 tablet (40 mg total) by mouth daily. 30 tablet 0  . rivaroxaban (XARELTO) 20 MG TABS tablet Take 1 tablet (20 mg total) by mouth daily with supper. 30 tablet 11   No current facility-administered medications on file prior to visit.    Review of Systems:  As per HPI, otherwise negative.    Physical Examination: Filed Vitals:    12/29/13 1021  BP: 120/72  Pulse: 60  Temp: 97.8 F (36.6 C)  Resp: 18   Filed Vitals:   12/29/13 1021  Height: 5\' 11"  (1.803 m)  Weight: 205 lb (92.987 kg)   Body mass index is 28.6 kg/(m^2). Ideal Body Weight: Weight in (lb) to have BMI = 25: 178.9   GEN: WDWN, NAD, Non-toxic, Alert & Oriented x 3 HEENT: Atraumatic, Normocephalic.  Ears and Nose: No external deformity. EXTR: No clubbing/cyanosis/edema NEURO: Normal gait.  PSYCH: Normally interactive. Conversant. Not depressed or anxious appearing.  Calm demeanor.    Assessment and Plan: Elevated lipids Long time treatment with xarelto for DVT   Signed,  Phillips OdorJeffery Ludell Zacarias, MD

## 2013-12-31 ENCOUNTER — Other Ambulatory Visit: Payer: Self-pay | Admitting: Physician Assistant

## 2014-05-06 ENCOUNTER — Ambulatory Visit (INDEPENDENT_AMBULATORY_CARE_PROVIDER_SITE_OTHER): Payer: Federal, State, Local not specified - PPO | Admitting: Physician Assistant

## 2014-05-06 VITALS — BP 110/70 | HR 66 | Temp 97.7°F | Ht 70.25 in | Wt 200.1 lb

## 2014-05-06 DIAGNOSIS — L02511 Cutaneous abscess of right hand: Secondary | ICD-10-CM | POA: Diagnosis not present

## 2014-05-06 DIAGNOSIS — M79644 Pain in right finger(s): Secondary | ICD-10-CM | POA: Diagnosis not present

## 2014-05-06 MED ORDER — DOXYCYCLINE HYCLATE 100 MG PO CAPS: 100.0000 mg | ORAL_CAPSULE | Freq: Two times a day (BID) | ORAL | Status: DC

## 2014-05-06 MED ORDER — TRAMADOL HCL 50 MG PO TABS: 50.0000 mg | ORAL_TABLET | Freq: Three times a day (TID) | ORAL | Status: DC | PRN

## 2014-05-06 NOTE — Progress Notes (Signed)
   Subjective:    Patient ID: Ryan Gutierrez, male    DOB: 18-Apr-1960, 54 y.o.   MRN: [id number]  HPI Patient presents for index finger of right hand pain that has been present for 5 days, however, he injured finger over 1 week ago. Initial wound healed days ago. Pain, however, has gotten worse and has noticed blood under skin. Pain does not radiate, but is now 9/10 on pain scale. Denies fever, loss of ROM/sensation/function, weakness, or numbness. NKDA.   Take Xarelto for DVT he had last year.    Review of Systems  Constitutional: Negative for fever and chills.  Skin: Positive for wound.  Neurological: Negative for weakness and numbness.       Objective:   Physical Exam  Constitutional: He is oriented to person, place, and time. He appears well-developed and well-nourished. No distress.  Blood pressure 110/70, pulse 66, temperature 97.7 F (36.5 C), temperature source Oral, height 5' 10.25" (1.784 m), weight 200 lb 2 oz (90.776 kg), SpO2 98 %.  HENT:  Head: Normocephalic and atraumatic.  Right Ear: External ear normal.  Left Ear: External ear normal.  Pulmonary/Chest: Effort normal.  Neurological: He is alert and oriented to person, place, and time.  Skin: Skin is warm, dry and intact. No rash noted. He is not diaphoretic. No erythema. No pallor.  Pus and blood under skin of finger.    Procedure Consent obtained. Metacarpal block with  4 cc of 1:1 1% lido and .5% marcaine. Iodine prep and sterile draping. Small incision made. Purulence expressed and culture obtained. Wound irrigated with 1 cc lido/marcaine. 1/4 plain packing placed. Clean dressing placed. Care instructions given.     Assessment & Plan:  1. Abscess of finger of right hand 2. Finger pain, right Warm compress 3-4x daily for 15-20 minutes. RTC 05/08/14 for wound care. - traMADol (ULTRAM) 50 MG tablet; Take 1 tablet (50 mg total) by mouth every 8 (eight) hours as needed.  Dispense: 30 tablet; Refill: 0 - doxycycline  (VIBRAMYCIN) 100 MG capsule; Take 1 capsule (100 mg total) by mouth 2 (two) times daily.  Dispense: 20 capsule; Refill: 0 - Wound culture     Janan Ridgeishira Drena Ham PA-C  [facility] 05/06/2014 1:57 PM

## 2014-05-06 NOTE — Progress Notes (Signed)
  Medical screening examination/treatment/procedure(s) were performed by non-physician practitioner and as supervising physician I was immediately available for consultation/collaboration.     

## 2014-05-08 ENCOUNTER — Encounter: Payer: Self-pay | Admitting: Physician Assistant

## 2014-05-08 ENCOUNTER — Ambulatory Visit (INDEPENDENT_AMBULATORY_CARE_PROVIDER_SITE_OTHER): Payer: Federal, State, Local not specified - PPO | Admitting: Physician Assistant

## 2014-05-08 VITALS — BP 114/70 | HR 65 | Temp 97.8°F | Resp 16 | Ht 70.5 in | Wt 201.0 lb

## 2014-05-08 DIAGNOSIS — M79644 Pain in right finger(s): Secondary | ICD-10-CM

## 2014-05-08 DIAGNOSIS — L02511 Cutaneous abscess of right hand: Secondary | ICD-10-CM

## 2014-05-08 LAB — WOUND CULTURE
Gram Stain: NONE SEEN
Gram Stain: NONE SEEN

## 2014-05-10 ENCOUNTER — Ambulatory Visit (INDEPENDENT_AMBULATORY_CARE_PROVIDER_SITE_OTHER): Payer: Federal, State, Local not specified - PPO | Admitting: Family Medicine

## 2014-05-10 VITALS — BP 120/84 | HR 70 | Temp 97.6°F | Resp 20 | Ht 70.5 in | Wt 204.0 lb

## 2014-05-10 DIAGNOSIS — S61209D Unspecified open wound of unspecified finger without damage to nail, subsequent encounter: Secondary | ICD-10-CM | POA: Diagnosis not present

## 2014-05-10 NOTE — Patient Instructions (Signed)
Soap and water to affected area twice per day but warm compresses at least 4-5 times per day.  I did not place packing again today as it appears ok. Change bandage after cleansing and if it becomes soaked - change bandage again.  Make sure to take your antibiotic twice per day. Recheck in 2 days with Ryan LennertSarah Gutierrez.   Return to the clinic or go to the nearest emergency room if any of your symptoms worsen or new symptoms occur.

## 2014-05-10 NOTE — Progress Notes (Signed)
Subjective:    Patient ID: Ryan Gutierrez, male    DOB: 12/11/1960, 54 y.o.   MRN: [id number]  HPI Chief Complaint  Patient presents with  . Dressing Change    Right hand, middle finger   This chart was scribed for Meredith StaggersJeffrey Haniyah Maciolek, MD by Andrew Auaven Small, ED Scribe. This patient was seen in room 14 and the patient's care was started at 4:55 PM.  HPI Comments: Ryan Gutierrez is a 54 y.o. male who [facility]. Pt was seen 4/30 by Tishira for abscess to right index finger.  I&D during that visit and pt was prescribed 50mg   Tramadol and doxycycline and was advised to follow up in approximately 2 days. Pt states he has been taking abx, which he is tolerating well, and applying a heat pack, 4 times a day, since being seen. He admits missing today's dose due to work schedule. Pt has not changed bandage since last visit. Pt denies fever and chills.     Patient Active Problem List   Diagnosis Date Noted  . Mixed hyperlipidemia 05/26/2013  . Other pulmonary embolism and infarction 05/26/2013  . Long term (current) use of anticoagulants 05/26/2013  . Hematochezia 11/05/2011  . Recurrent deep vein thrombosis 10/24/2011   Past Medical History  Diagnosis Date  . Clotting disorder   . Hyperlipidemia   . DVT (deep venous thrombosis)     post-knee surgery  . Sickle cell trait    Past Surgical History  Procedure Laterality Date  . Hernia repair    . Knee surgery  1990s   No Known Allergies Prior to Admission medications   Medication Sig Start Date End Date Taking? Authorizing Provider  atorvastatin (LIPITOR) 40 MG tablet TAKE 1 TABLET BY MOUTH DAILY 01/02/14  Yes Carmelina DaneJeffery S Anderson, MD  doxycycline (VIBRAMYCIN) 100 MG capsule Take 1 capsule (100 mg total) by mouth 2 (two) times daily. 05/06/14  Yes Tishira R Brewington, PA-C  rivaroxaban (XARELTO) 20 MG TABS tablet Take 1 tablet (20 mg total) by mouth daily with supper. 07/12/13  Yes Chrystie NoseKenneth C Hilty, MD   traMADol (ULTRAM) 50 MG tablet Take 1 tablet (50 mg total) by mouth every 8 (eight) hours as needed. 05/06/14  Yes Tishira R Brewington, PA-C   History   Social History  . Marital Status: Single    Spouse Name: N/A  . Number of Children: N/A  . Years of Education: N/A   Occupational History  . Not on file.   Social History Main Topics  . Smoking status: Never Smoker   . Smokeless tobacco: Never Used  . Alcohol Use: No  . Drug Use: No  . Sexual Activity: Not on file   Other Topics Concern  . Not on file   Social History Narrative    Review of Systems  Constitutional: Negative for fever and chills.  Skin: Positive for wound.    Objective:   Physical Exam  Constitutional: He is oriented to person, place, and time. He appears well-developed and well-nourished. No distress.  HENT:  Head: Normocephalic and atraumatic.  Eyes: Conjunctivae and EOM are normal.  Neck: Neck supple.  Cardiovascular: Normal rate.   Pulmonary/Chest: Effort normal.  Musculoskeletal: Normal range of motion.  Neurological: He is alert and oriented to person, place, and time.  Skin: Skin is warm and dry.  Right hand- third phalanx, radial aspect wound with bandage intact. Finger pad non tender. No appreciable swelling or tightness. Approximately .  5-.75 cm packing was removed easily, with granulation tissue at the base,  Psychiatric: He has a normal mood and affect. His behavior is normal.  Nursing note and vitals reviewed.  Filed Vitals:   05/10/14 1647  BP: 120/84  Pulse: 70  Temp: 97.6 F (36.4 C)  TempSrc: Oral  Resp: 20  Height: 5' 10.5" (1.791 m)  Weight: 204 lb (92.534 kg)  SpO2: 98%     Results for orders placed or performed in visit on 05/06/14  Wound culture  Result Value Ref Range   Culture      Abundant METHICILLIN RESISTANT STAPHYLOCOCCUS AUREUS   Gram Stain No WBC Seen    Gram Stain No Squamous Epithelial Cells Seen    Gram Stain Rare Gram Positive Cocci In Pairs     Organism ID, Bacteria METHICILLIN RESISTANT STAPHYLOCOCCUS AUREUS       Susceptibility   Methicillin resistant staphylococcus aureus -  (no method available)    PENICILLIN >=0.5 Resistant     OXACILLIN >=4 Resistant     CEFAZOLIN  Resistant     GENTAMICIN <=0.5 Sensitive     CIPROFLOXACIN 4 Resistant     LEVOFLOXACIN 4 Intermediate     TRIMETH/SULFA <=10 Sensitive     VANCOMYCIN <=0.5 Sensitive     CLINDAMYCIN <=0.25 Sensitive     ERYTHROMYCIN >=8 Resistant     LINEZOLID 2 Sensitive     RIFAMPIN <=0.5 Sensitive     TETRACYCLINE <=1 Sensitive     Assessment & Plan:  Ryan Gutierrez is a 54 y.o. male Wound, open, finger, subsequent encounter  Appears stable, with minimal depth of wound, and wide enough opening that further packing not necessary at this time.  -cont doxy for MRSA.   -wound care discussed, with repeat wound eval in 2 days.   -RTC precautions.   No orders of the defined types were placed in this encounter.   Patient Instructions  Soap and water to affected area twice per day but warm compresses at least 4-5 times per day.  I did not place packing again today as it appears ok. Change bandage after cleansing and if it becomes soaked - change bandage again.  Make sure to take your antibiotic twice per day. Recheck in 2 days with Benny LennertSarah Weber.   Return to the clinic or go to the nearest emergency room if any of your symptoms worsen or new symptoms occur.        I personally performed the services described in this documentation, which was scribed in my presence. The recorded information has been reviewed and considered, and addended by me as needed.

## 2014-05-11 ENCOUNTER — Encounter: Payer: Self-pay | Admitting: Physician Assistant

## 2014-05-11 NOTE — Progress Notes (Signed)
   Subjective:    Patient ID: Ryan Gutierrez, male    DOB: Aug 27, 1960, 54 y.o.   MRN: [id number]  HPI Patient present for wound care following I&D of finger abscess to days ago. States that pain has improved and has not changed dressing so not sure if redness has spread. Has used warm compresses and is compliant with doxycycline which he is tolerating well. Denies fever, swelling, loss of function/ROM.   Review of Systems  Constitutional: Negative for fever and chills.  Musculoskeletal: Negative for joint swelling.  Skin: Positive for wound.       Objective:   Physical Exam  Constitutional: He is oriented to person, place, and time. He appears well-developed and well-nourished. No distress.  Blood pressure 114/70, pulse 65, temperature 97.8 F (36.6 C), temperature source Oral, resp. rate 16, height 5' 10.5" (1.791 m), weight 201 lb (91.173 kg), SpO2 97 %.  HENT:  Head: Normocephalic and atraumatic.  Right Ear: External ear normal.  Left Ear: External ear normal.  Pulmonary/Chest: Effort normal.  Neurological: He is alert and oriented to person, place, and time.  Skin: Skin is warm and dry. No rash noted. He is not diaphoretic. No erythema. No pallor.  Wound: Dressing intact. Upon removal of dressing and packing, purulence begins to drain. No erythema. No swelling. Wound worse than 2 days with larger surface of necrotic tissue.   Procedure Consent obtained. 1 cc 1% lido local anesthesia. Purulence expressed. Wound irrigated and explored. Necrotic tissue removed. epidermis trimmed. Loose packing placed. Clean dressing placed. Care instructions discussed.     Assessment & Plan:  1. Abscess of finger of right hand 2. Finger pain, right RTC 05/10/14 for wound care. Discussed culture results and should continue doxycycline as it is sensitive. Tramadol prn for pain. Warm compresses.    Janan Ridgeishira Deuce Paternoster PA-C  [facility] 05/11/2014 7:54  PM

## 2014-05-12 ENCOUNTER — Ambulatory Visit (INDEPENDENT_AMBULATORY_CARE_PROVIDER_SITE_OTHER): Payer: Federal, State, Local not specified - PPO | Admitting: Family Medicine

## 2014-05-12 VITALS — BP 130/90 | HR 68 | Temp 98.3°F | Resp 16

## 2014-05-12 DIAGNOSIS — Z5189 Encounter for other specified aftercare: Secondary | ICD-10-CM

## 2014-05-12 NOTE — Patient Instructions (Signed)
Keep a band-aid on your finger until totally healed.  However otherwise you do not have to come back and see us as long as it is healing

## 2014-05-12 NOTE — Progress Notes (Signed)
[facility] [address], [place] [address] [phone]  Date:  05/12/2014   Name:  Ryan Saanthony Martinson   DOB:  07-05-1960   MRN:  [id number]  PCP:  No PCP Per Patient    Chief Complaint: Wound Check   History of Present Illness:  Ryan Gutierrez is a 54 y.o. very pleasant male patient who presents with the following:  I and D for abscess to right long finger on 4/30- followed up on 5/2 and 5/4.  He was noted to have MRSA-  He is taking doxycycline.   The finger is feeling better.   He has noted just a little drainage.     Patient Active Problem List   Diagnosis Date Noted  . Mixed hyperlipidemia 05/26/2013  . Other pulmonary embolism and infarction 05/26/2013  . Long term (current) use of anticoagulants 05/26/2013  . Hematochezia 11/05/2011  . Recurrent deep vein thrombosis 10/24/2011    Past Medical History  Diagnosis Date  . Clotting disorder   . Hyperlipidemia   . DVT (deep venous thrombosis)     post-knee surgery  . Sickle cell trait     Past Surgical History  Procedure Laterality Date  . Hernia repair    . Knee surgery  1990s    History  Substance Use Topics  . Smoking status: Never Smoker   . Smokeless tobacco: Never Used  . Alcohol Use: No    Family History  Problem Relation Age of Onset  . Heart disease Mother   . Heart attack Mother   . Diabetes Sister   . Hypertension Sister   . Sickle cell anemia Son     No Known Allergies  Medication list has been reviewed and updated.  Current Outpatient Prescriptions on File Prior to Visit  Medication Sig Dispense Refill  . atorvastatin (LIPITOR) 40 MG tablet TAKE 1 TABLET BY MOUTH DAILY 30 tablet 5  . doxycycline (VIBRAMYCIN) 100 MG capsule Take 1 capsule (100 mg total) by mouth 2 (two) times daily. 20 capsule 0  . rivaroxaban (XARELTO) 20 MG TABS tablet Take 1 tablet (20 mg total) by mouth daily with supper. 30 tablet 11  . traMADol (ULTRAM) 50 MG tablet Take 1 tablet (50 mg  total) by mouth every 8 (eight) hours as needed. 30 tablet 0   No current facility-administered medications on file prior to visit.    Review of Systems:  As per HPI- otherwise negative.   Physical Examination: Filed Vitals:   05/12/14 1635  BP: 130/90  Pulse: 68  Temp: 98.3 F (36.8 C)  Resp: 16   There were no vitals filed for this visit. There is no weight on file to calculate BMI. Ideal Body Weight:    GEN: WDWN, NAD, Non-toxic, A & O x 3, looks well HEENT: Atraumatic, Normocephalic. Neck supple. No masses, No LAD. Ears and Nose: No external deformity. CV: RRR, No M/G/R. No JVD. No thrill. No extra heart sounds. PULM: CTA B, no wheezes, crackles, rhonchi. No retractions. No resp. distress. No accessory muscle use. EXTR: No c/c/e NEURO Normal gait.  PSYCH: Normally interactive. Conversant. Not depressed or anxious appearing.  Calm demeanor.  Right long finger: small wound with bright red blood when wound squeezed gently- No pus, no redness, no tenderness, normal strength and sensation of finger.    Assessment and Plan: Encounter for wound care healing well,  Keep covered until totally healed but OW no need to return unless any sign of infection.  Signed Lamar Blinks, MD

## 2014-10-31 ENCOUNTER — Ambulatory Visit (INDEPENDENT_AMBULATORY_CARE_PROVIDER_SITE_OTHER): Payer: Federal, State, Local not specified - PPO | Admitting: Emergency Medicine

## 2014-10-31 VITALS — BP 122/70 | HR 63 | Temp 97.5°F | Resp 16 | Ht 70.5 in | Wt 198.0 lb

## 2014-10-31 DIAGNOSIS — Z0000 Encounter for general adult medical examination without abnormal findings: Secondary | ICD-10-CM

## 2014-10-31 DIAGNOSIS — E782 Mixed hyperlipidemia: Secondary | ICD-10-CM | POA: Diagnosis not present

## 2014-10-31 DIAGNOSIS — Z7901 Long term (current) use of anticoagulants: Secondary | ICD-10-CM | POA: Diagnosis not present

## 2014-10-31 DIAGNOSIS — Z23 Encounter for immunization: Secondary | ICD-10-CM

## 2014-10-31 LAB — COMPREHENSIVE METABOLIC PANEL
ALBUMIN: 3.9 g/dL (ref 3.6–5.1)
ALT: 14 U/L (ref 9–46)
AST: 21 U/L (ref 10–35)
Alkaline Phosphatase: 80 U/L (ref 40–115)
BUN: 15 mg/dL (ref 7–25)
CO2: 27 mmol/L (ref 20–31)
Calcium: 9.2 mg/dL (ref 8.6–10.3)
Chloride: 105 mmol/L (ref 98–110)
Creat: 1.03 mg/dL (ref 0.70–1.33)
Glucose, Bld: 88 mg/dL (ref 65–99)
POTASSIUM: 4.2 mmol/L (ref 3.5–5.3)
Sodium: 140 mmol/L (ref 135–146)
TOTAL PROTEIN: 7.5 g/dL (ref 6.1–8.1)
Total Bilirubin: 0.7 mg/dL (ref 0.2–1.2)

## 2014-10-31 LAB — CBC
HEMATOCRIT: 40.5 % (ref 39.0–52.0)
Hemoglobin: 14 g/dL (ref 13.0–17.0)
MCH: 29 pg (ref 26.0–34.0)
MCHC: 34.6 g/dL (ref 30.0–36.0)
MCV: 84 fL (ref 78.0–100.0)
MPV: 11.6 fL (ref 8.6–12.4)
PLATELETS: 189 10*3/uL (ref 150–400)
RBC: 4.82 MIL/uL (ref 4.22–5.81)
RDW: 13.9 % (ref 11.5–15.5)
WBC: 4.7 10*3/uL (ref 4.0–10.5)

## 2014-10-31 LAB — LIPID PANEL
CHOL/HDL RATIO: 6.1 ratio — AB (ref <=5.0)
CHOLESTEROL: 232 mg/dL — AB (ref 125–200)
HDL: 38 mg/dL — ABNORMAL LOW (ref >=40)
LDL Cholesterol: 166 mg/dL — ABNORMAL HIGH (ref <130)
TRIGLYCERIDES: 140 mg/dL (ref <150)
VLDL: 28 mg/dL (ref <30)

## 2014-10-31 MED ORDER — ATORVASTATIN CALCIUM 40 MG PO TABS: 40.0000 mg | ORAL_TABLET | Freq: Every day | ORAL | Status: DC

## 2014-10-31 MED ORDER — RIVAROXABAN 20 MG PO TABS: 20.0000 mg | ORAL_TABLET | Freq: Every day | ORAL | Status: DC

## 2014-10-31 NOTE — Progress Notes (Signed)
Subjective:  Patient ID: Ryan Gutierrez, male    DOB: 05/02/1960  Age: 54 y.o. MRN: [id number]  CC: Annual Exam; Immunizations; and Medication Refill   HPI Ryan Gutierrez presents  with refills on his medication. He has a history of multiple episodes of DVT. These been on Coumadin and recently changed to Xarelto. He said he wants to get off the Xarelto because he is afraid of all the potential adverse effects based on the lawyers like advertisements on television. Denies Having any adverse side effects or evidence of an endorgan injury. He denies any acute complaint he's not had a colonoscopy.  History Ryan Gutierrez has a past medical history of Clotting disorder (HCC); Hyperlipidemia; DVT (deep venous thrombosis) (HCC); and Sickle cell trait (HCC).   He has past surgical history that includes Hernia repair and Knee surgery (1990s).   His  family history includes Diabetes in his sister; Heart attack in his mother; Heart disease in his mother; Hypertension in his sister; Sickle cell anemia in his son.  He   reports that he has never smoked. He has never used smokeless tobacco. He reports that he does not drink alcohol or use illicit drugs.  Outpatient Prescriptions Prior to Visit  Medication Sig Dispense Refill  . atorvastatin (LIPITOR) 40 MG tablet TAKE 1 TABLET BY MOUTH DAILY 30 tablet 5  . doxycycline (VIBRAMYCIN) 100 MG capsule Take 1 capsule (100 mg total) by mouth 2 (two) times daily. (Patient not taking: Reported on 10/31/2014) 20 capsule 0  . rivaroxaban (XARELTO) 20 MG TABS tablet Take 1 tablet (20 mg total) by mouth daily with supper. (Patient not taking: Reported on 10/31/2014) 30 tablet 11  . traMADol (ULTRAM) 50 MG tablet Take 1 tablet (50 mg total) by mouth every 8 (eight) hours as needed. (Patient not taking: Reported on 10/31/2014) 30 tablet 0   No facility-administered medications prior to visit.    Social History   Social History  . Marital Status: Married    Spouse  Name: N/A  . Number of Children: N/A  . Years of Education: N/A   Social History Main Topics  . Smoking status: Never Smoker   . Smokeless tobacco: Never Used  . Alcohol Use: No  . Drug Use: No  . Sexual Activity: Not Asked   Other Topics Concern  . None   Social History Narrative     Review of Systems  Constitutional: Negative for fever, chills and appetite change.  HENT: Negative for congestion, ear pain, postnasal drip, sinus pressure and sore throat.   Eyes: Negative for pain and redness.  Respiratory: Negative for cough, shortness of breath and wheezing.   Cardiovascular: Negative for leg swelling.  Gastrointestinal: Negative for nausea, vomiting, abdominal pain, diarrhea, constipation and blood in stool.  Endocrine: Negative for polyuria.  Genitourinary: Negative for dysuria, urgency, frequency and flank pain.  Musculoskeletal: Negative for gait problem.  Skin: Negative for rash.  Neurological: Negative for weakness and headaches.  Psychiatric/Behavioral: Negative for confusion and decreased concentration. The patient is not nervous/anxious.     Objective:  BP 122/70 mmHg  Pulse 63  Temp(Src) 97.5 F (36.4 C) (Oral)  Resp 16  Ht 5' 10.5" (1.791 m)  Wt 198 lb (89.812 kg)  BMI 28.00 kg/m2  SpO2 98%  Physical Exam  Constitutional: He is oriented to person, place, and time. He appears well-developed and well-nourished. No distress.  HENT:  Head: Normocephalic and atraumatic.  Right Ear: External ear normal.  Left Ear: External ear normal.  Nose: Nose normal.  Eyes: Conjunctivae and EOM are normal. Pupils are equal, round, and reactive to light. No scleral icterus.  Neck: Normal range of motion. Neck supple. No tracheal deviation present.  Cardiovascular: Normal rate, regular rhythm and normal heart sounds.   Pulmonary/Chest: Effort normal. No respiratory distress. He has no wheezes. He has no rales.  Abdominal: He exhibits no mass. There is no tenderness.  There is no rebound and no guarding.  Musculoskeletal: He exhibits no edema.  Lymphadenopathy:    He has no cervical adenopathy.  Neurological: He is alert and oriented to person, place, and time. Coordination normal.  Skin: Skin is warm and dry. No rash noted.  Psychiatric: He has a normal mood and affect. His behavior is normal.      Assessment & Plan:   Philopateer was seen today for annual exam, immunizations and medication refill.  Diagnoses and all orders for this visit:  Annual physical exam -     CBC -     Comprehensive metabolic panel -     Lipid panel -     PSA  Long term (current) use of anticoagulants -     CBC -     Comprehensive metabolic panel -     Lipid panel -     PSA  Mixed hyperlipidemia -     CBC -     Comprehensive metabolic panel -     Lipid panel -     PSA   I am having Ryan Gutierrez maintain his rivaroxaban, atorvastatin, traMADol, and doxycycline.  No orders of the defined types were placed in this encounter.    Appropriate red flag conditions were discussed with the patient as well as actions that should be taken.  Patient expressed his understanding.  Follow-up: Return in about 6 months (around 05/01/2015).  Carmelina Dane, MD

## 2014-10-31 NOTE — Patient Instructions (Signed)
Rivaroxaban oral tablets What is this medicine? RIVAROXABAN (ri va ROX a ban) is an anticoagulant (blood thinner). It is used to treat blood clots in the lungs or in the veins. It is also used after knee or hip surgeries to prevent blood clots. It is also used to lower the chance of stroke in people with a medical condition called atrial fibrillation. This medicine may be used for other purposes; ask your health care provider or pharmacist if you have questions. What should I tell my health care provider before I take this medicine? They need to know if you have any of these conditions: -bleeding disorders -bleeding in the brain -blood in your stools (black or tarry stools) or if you have blood in your vomit -history of stomach bleeding -kidney disease -liver disease -low blood counts, like low white cell, platelet, or red cell counts -recent or planned spinal or epidural procedure -take medicines that treat or prevent blood clots -an unusual or allergic reaction to rivaroxaban, other medicines, foods, dyes, or preservatives -pregnant or trying to get pregnant -breast-feeding How should I use this medicine? Take this medicine by mouth with a glass of water. Follow the directions on the prescription label. Take your medicine at regular intervals. Do not take it more often than directed. Do not stop taking except on your doctor's advice. Stopping this medicine may increase your risk of a blood clot. Be sure to refill your prescription before you run out of medicine. If you are taking this medicine after hip or knee replacement surgery, take it with or without food. If you are taking this medicine for atrial fibrillation, take it with your evening meal. If you are taking this medicine to treat blood clots, take it with food at the same time each day. If you are unable to swallow your tablet, you may crush the tablet and mix it in applesauce. Then, immediately eat the applesauce. You should eat more  food right after you eat the applesauce containing the crushed tablet. Talk to your pediatrician regarding the use of this medicine in children. Special care may be needed. Overdosage: If you think you have taken too much of this medicine contact a poison control center or emergency room at once. NOTE: This medicine is only for you. Do not share this medicine with others. What if I miss a dose? If you take your medicine once a day and miss a dose, take the missed dose as soon as you remember. If you take your medicine twice a day and miss a dose, take the missed dose immediately. In this instance, 2 tablets may be taken at the same time. The next day you should take 1 tablet twice a day as directed. What may interact with this medicine? -aspirin and aspirin-like medicines -certain antibiotics like erythromycin, azithromycin, and clarithromycin -certain medicines for fungal infections like ketoconazole and itraconazole -certain medicines for irregular heart beat like amiodarone, quinidine, dronedarone -certain medicines for seizures like carbamazepine, phenytoin -certain medicines that treat or prevent blood clots like warfarin, enoxaparin, and dalteparin -conivaptan -diltiazem -felodipine -indinavir -lopinavir; ritonavir -NSAIDS, medicines for pain and inflammation, like ibuprofen or naproxen -ranolazine -rifampin -ritonavir -St. John's wort -verapamil This list may not describe all possible interactions. Give your health care provider a list of all the medicines, herbs, non-prescription drugs, or dietary supplements you use. Also tell them if you smoke, drink alcohol, or use illegal drugs. Some items may interact with your medicine. What should I watch for while using this   medicine? Visit your doctor or health care professional for regular checks on your progress. Your condition will be monitored carefully while you are receiving this medicine. Notify your doctor or health care  professional and seek emergency treatment if you develop breathing problems; changes in vision; chest pain; severe, sudden headache; pain, swelling, warmth in the leg; trouble speaking; sudden numbness or weakness of the face, arm, or leg. These can be signs that your condition has gotten worse. If you are going to have surgery, tell your doctor or health care professional that you are taking this medicine. Tell your health care professional that you use this medicine before you have a spinal or epidural procedure. Sometimes people who take this medicine have bleeding problems around the spine when they have a spinal or epidural procedure. This bleeding is very rare. If you have a spinal or epidural procedure while on this medicine, call your health care professional immediately if you have back pain, numbness or tingling (especially in your legs and feet), muscle weakness, paralysis, or loss of bladder or bowel control. Avoid sports and activities that might cause injury while you are using this medicine. Severe falls or injuries can cause unseen bleeding. Be careful when using sharp tools or knives. Consider using an electric razor. Take special care brushing or flossing your teeth. Report any injuries, bruising, or red spots on the skin to your doctor or health care professional. What side effects may I notice from receiving this medicine? Side effects that you should report to your doctor or health care professional as soon as possible: -allergic reactions like skin rash, itching or hives, swelling of the face, lips, or tongue -back pain -redness, blistering, peeling or loosening of the skin, including inside the mouth -signs and symptoms of bleeding such as bloody or black, tarry stools; red or dark-brown urine; spitting up blood or brown material that looks like coffee grounds; red spots on the skin; unusual bruising or bleeding from the eye, gums, or nose Side effects that usually do not require  medical attention (Report these to your doctor or health care professional if they continue or are bothersome.): -dizziness -muscle pain This list may not describe all possible side effects. Call your doctor for medical advice about side effects. You may report side effects to FDA at 1-800-FDA-1088. Where should I keep my medicine? Keep out of the reach of children. Store at room temperature between 15 and 30 degrees C (59 and 86 degrees F). Throw away any unused medicine after the expiration date. NOTE: This sheet is a summary. It may not cover all possible information. If you have questions about this medicine, talk to your doctor, pharmacist, or health care provider.    2016, Elsevier/Gold Standard. (2013-12-21 12:45:34)  

## 2014-11-01 LAB — PSA: PSA: 1.44 ng/mL (ref <=4.00)

## 2014-11-01 NOTE — Addendum Note (Signed)
Addended by: Carmelina DaneANDERSON, Savaya Hakes S on: 11/01/2014 08:49 AM   Modules accepted: K[employer] BroodSmartSet

## 2014-12-29 ENCOUNTER — Ambulatory Visit (INDEPENDENT_AMBULATORY_CARE_PROVIDER_SITE_OTHER): Payer: Federal, State, Local not specified - PPO | Admitting: Emergency Medicine

## 2014-12-29 VITALS — BP 112/82 | HR 69 | Temp 97.7°F | Resp 16 | Ht 71.0 in | Wt 200.0 lb

## 2014-12-29 DIAGNOSIS — N3000 Acute cystitis without hematuria: Secondary | ICD-10-CM | POA: Diagnosis not present

## 2014-12-29 DIAGNOSIS — R319 Hematuria, unspecified: Secondary | ICD-10-CM

## 2014-12-29 LAB — POCT URINALYSIS DIP (MANUAL ENTRY)
GLUCOSE UA: NEGATIVE
NITRITE UA: NEGATIVE
SPEC GRAV UA: 1.015
UROBILINOGEN UA: 1
pH, UA: 5

## 2014-12-29 LAB — POC MICROSCOPIC URINALYSIS (UMFC): Mucus: ABSENT

## 2014-12-29 MED ORDER — CIPROFLOXACIN HCL 500 MG PO TABS: 500.0000 mg | ORAL_TABLET | Freq: Two times a day (BID) | ORAL | Status: DC

## 2014-12-29 NOTE — Patient Instructions (Signed)

## 2014-12-29 NOTE — Progress Notes (Signed)
Subjective:  Patient ID: Ryan Gutierrez, male    DOB: Mar 10, 1960  Age: 54 y.o. MRN: [id number]  CC: Hematuria   HPI Ryan Gutierrez presents  patient got up to to urinate at 4:00 this morning's blood in his urine. In gross blood in his urine later today in the all at work. He has no fever chills nausea vomiting no back pain abdominal pain. He has no dysuria urgency or frequency. He has no hesitancy or dribbling. No double voiding. Denies any other complaints. He said he did not have a clear he urine void all day  History Ryan Brownsnthony has a past medical history of Clotting disorder (HCC); Hyperlipidemia; DVT (deep venous thrombosis) (HCC); and Sickle cell trait (HCC).   He has past surgical history that includes Hernia repair and Knee surgery (1990s).   His  family history includes Diabetes in his sister; Heart attack in his mother; Heart disease in his mother; Hypertension in his sister; Sickle cell anemia in his son.  He   reports that he has never smoked. He has never used smokeless tobacco. He reports that he does not drink alcohol or use illicit drugs.  Outpatient Prescriptions Prior to Visit  Medication Sig Dispense Refill  . atorvastatin (LIPITOR) 40 MG tablet Take 1 tablet (40 mg total) by mouth daily. 30 tablet 5  . rivaroxaban (XARELTO) 20 MG TABS tablet Take 1 tablet (20 mg total) by mouth daily with supper. 30 tablet 11  . doxycycline (VIBRAMYCIN) 100 MG capsule Take 1 capsule (100 mg total) by mouth 2 (two) times daily. (Patient not taking: Reported on 10/31/2014) 20 capsule 0  . traMADol (ULTRAM) 50 MG tablet Take 1 tablet (50 mg total) by mouth every 8 (eight) hours as needed. (Patient not taking: Reported on 10/31/2014) 30 tablet 0   No facility-administered medications prior to visit.    Social History   Social History  . Marital Status: Married    Spouse Name: N/A  . Number of Children: N/A  . Years of Education: N/A   Social History Main Topics  . Smoking status:  Never Smoker   . Smokeless tobacco: Never Used  . Alcohol Use: No  . Drug Use: No  . Sexual Activity: Not Asked   Other Topics Concern  . None   Social History Narrative     Review of Systems  Constitutional: Negative for fever, chills and appetite change.  HENT: Negative for congestion, ear pain, postnasal drip, sinus pressure and sore throat.   Eyes: Negative for pain and redness.  Respiratory: Negative for cough, shortness of breath and wheezing.   Cardiovascular: Negative for leg swelling.  Gastrointestinal: Negative for nausea, vomiting, abdominal pain, diarrhea, constipation and blood in stool.  Endocrine: Negative for polyuria.  Genitourinary: Positive for hematuria (visible blood no clots). Negative for dysuria, urgency, frequency and flank pain.  Musculoskeletal: Negative for gait problem.  Skin: Negative for rash.  Neurological: Negative for weakness and headaches.  Psychiatric/Behavioral: Negative for confusion and decreased concentration. The patient is not nervous/anxious.     Objective:  BP 112/82 mmHg  Pulse 69  Temp(Src) 97.7 F (36.5 C)  Resp 16  Ht 5\' 11"  (1.803 m)  Wt 200 lb (90.719 kg)  BMI 27.91 kg/m2  SpO2 97%  Physical Exam  Constitutional: He is oriented to person, place, and time. He appears well-developed and well-nourished.  HENT:  Head: Normocephalic and atraumatic.  Eyes: Conjunctivae are normal. Pupils are equal, round, and reactive to light.  Pulmonary/Chest: Effort  normal.  Abdominal: Soft. There is no tenderness. There is no CVA tenderness.  Musculoskeletal: He exhibits no edema.  Neurological: He is alert and oriented to person, place, and time.  Skin: Skin is dry.  Psychiatric: He has a normal mood and affect. His behavior is normal. Thought content normal.      Assessment & Plan:   Ryan Gutierrez was seen today for hematuria.  Diagnoses and all orders for this visit:  Hematuria -     POCT urinalysis dipstick -     POCT  Microscopic Urinalysis ([facility])  Acute cystitis without hematuria  Other orders -     ciprofloxacin (CIPRO) 500 MG tablet; Take 1 tablet (500 mg total) by mouth 2 (two) times daily.   I have discontinued Mr. Ryan Gutierrez's traMADol and doxycycline. I am also having him start on ciprofloxacin. Additionally, I am having him maintain his atorvastatin and rivaroxaban.  Meds ordered this encounter  Medications  . ciprofloxacin (CIPRO) 500 MG tablet    Sig: Take 1 tablet (500 mg total) by mouth 2 (two) times daily.    Dispense:  30 tablet    Refill:  0   I instructed him in follow-up in 30 days following completion of his antibiotic for repeat urinalysis   Appropriate red flag conditions were discussed with the patient as well as actions that should be taken.  Patient expressed his understanding.  Follow-up: Return if symptoms worsen or fail to improve.  Ryan Dane, MD   Results for orders placed or performed in visit on 12/29/14  POCT urinalysis dipstick  Result Value Ref Range   Color, UA Gutierrez (A) yellow   Clarity, UA hazy (A) clear   Glucose, UA negative negative   Bilirubin, UA large (A) negative   Ketones, POC UA small (15) (A) negative   Spec Grav, UA 1.015    Blood, UA large (A) negative   pH, UA 5.0    Protein Ur, POC >=300 (A) negative   Urobilinogen, UA 1.0    Nitrite, UA Negative Negative   Leukocytes, UA large (3+) (A) Negative  POCT Microscopic Urinalysis ([facility])  Result Value Ref Range   WBC,UR,HPF,POC Too numerous to count  (A) None WBC/hpf   RBC,UR,HPF,POC Too numerous to count  (A) None RBC/hpf   Bacteria Few (A) None, Too numerous to count   Mucus Absent Absent   Epithelial Cells, UR Per Microscopy None None, Too numerous to count cells/hpf

## 2015-01-08 ENCOUNTER — Ambulatory Visit (INDEPENDENT_AMBULATORY_CARE_PROVIDER_SITE_OTHER): Payer: Federal, State, Local not specified - PPO | Admitting: Family Medicine

## 2015-01-08 VITALS — BP 124/78 | HR 100 | Temp 98.2°F | Resp 17 | Ht 70.5 in | Wt 205.0 lb

## 2015-01-08 DIAGNOSIS — R310 Gross hematuria: Secondary | ICD-10-CM | POA: Diagnosis not present

## 2015-01-08 LAB — POCT URINALYSIS DIP (MANUAL ENTRY)
BILIRUBIN UA: NEGATIVE
BILIRUBIN UA: NEGATIVE
Glucose, UA: NEGATIVE
LEUKOCYTES UA: NEGATIVE
Nitrite, UA: NEGATIVE
PH UA: 6.5
Protein Ur, POC: 100 — AB
Spec Grav, UA: 1.015
Urobilinogen, UA: 0.2

## 2015-01-08 LAB — POC MICROSCOPIC URINALYSIS (UMFC): MUCUS RE: ABSENT

## 2015-01-08 LAB — POCT CBC
Granulocyte percent: 57.8 %G (ref 37–80)
HEMATOCRIT: 42.8 % — AB (ref 43.5–53.7)
Hemoglobin: 14.6 g/dL (ref 14.1–18.1)
LYMPH, POC: 1.5 (ref 0.6–3.4)
MCH, POC: 29.2 pg (ref 27–31.2)
MCHC: 34.2 g/dL (ref 31.8–35.4)
MCV: 85.3 fL (ref 80–97)
MID (CBC): 1 — AB (ref 0–0.9)
MPV: 8.9 fL (ref 0–99.8)
POC GRANULOCYTE: 3.4 (ref 2–6.9)
POC LYMPH %: 24.7 % (ref 10–50)
POC MID %: 17.5 % — AB (ref 0–12)
Platelet Count, POC: 131 10*3/uL — AB (ref 142–424)
RBC: 5.02 M/uL (ref 4.69–6.13)
RDW, POC: 14.2 %
WBC: 5.9 10*3/uL (ref 4.6–10.2)

## 2015-01-08 NOTE — Progress Notes (Addendum)
, [facility] [address] [place] [address] [phone]  Date:  01/08/2015   Name:  Ryan Gutierrez   DOB:  [birth year]   MRN:  [id number]  PCP:  No PCP Per Patient    Chief Complaint: Dysuria   History of Present Illness:  Ryan Gutierrez is a 55 y.o. very pleasant male patient who presents with the following:  History of clotting disorder with PE/ pulm infaraction and DVT, now on long term anticoagulation with xarelto.  He has been on xarelto for over a year. He was seen here on 12/23 with complaint of hematuria- he had noted gross blood but no other sx that day.  He was treated with cipro.  No urine culture was done.  He is here today because the blood has continued since he was last seen- he has noted some bright red blood visible in his urine.  No clots noted.  He has noted persistent blood with every urination, can be more or less.  No other bleeding, no blood in his stool   He sometimes feels like he has some urgency but no dysuria.    He has never been a smoker.    CMP was done in October and it was normal   Results for orders placed or performed in visit on 12/29/14  POCT urinalysis dipstick  Result Value Ref Range   Color, UA brown (A) yellow   Clarity, UA hazy (A) clear   Glucose, UA negative negative   Bilirubin, UA large (A) negative   Ketones, POC UA small (15) (A) negative   Spec Grav, UA 1.015    Blood, UA large (A) negative   pH, UA 5.0    Protein Ur, POC >=300 (A) negative   Urobilinogen, UA 1.0    Nitrite, UA Negative Negative   Leukocytes, UA large (3+) (A) Negative  POCT Microscopic Urinalysis ([facility])  Result Value Ref Range   WBC,UR,HPF,POC Too numerous to count  (A) None WBC/hpf   RBC,UR,HPF,POC Too numerous to count  (A) None RBC/hpf   Bacteria Few (A) None, Too numerous to count   Mucus Absent Absent   Epithelial Cells, UR Per Microscopy None None, Too numerous to count cells/hpf     Patient Active Problem List   Diagnosis Date Noted  . Mixed hyperlipidemia 05/26/2013  . Other pulmonary embolism and infarction 05/26/2013  . Long term (current) use of anticoagulants 05/26/2013  . Hematochezia 11/05/2011  . Recurrent deep vein thrombosis (HCC) 10/24/2011    Past Medical History  Diagnosis Date  . Clotting disorder (HCC)   . Hyperlipidemia   . DVT (deep venous thrombosis) (HCC)     post-knee surgery  . Sickle cell trait [facility])     Past Surgical History  Procedure Laterality Date  . Hernia repair    . Knee surgery  1990s    Social History  Substance Use Topics  . Smoking status: Never Smoker   . Smokeless tobacco: Never Used  . Alcohol Use: No    Family History  Problem Relation Age of Onset  . Heart disease Mother   . Heart attack Mother   . Diabetes Sister   . Hypertension Sister   . Sickle cell anemia Son     No Known Allergies  Medication list has been reviewed and updated.  Current Outpatient Prescriptions on File Prior to Visit  Medication Sig Dispense Refill  . atorvastatin (LIPITOR) 40 MG tablet Take 1 tablet (40 mg total)  by mouth daily. 30 tablet 5  . ciprofloxacin (CIPRO) 500 MG tablet Take 1 tablet (500 mg total) by mouth 2 (two) times daily. 30 tablet 0  . rivaroxaban (XARELTO) 20 MG TABS tablet Take 1 tablet (20 mg total) by mouth daily with supper. 30 tablet 11   No current facility-administered medications on file prior to visit.    Review of Systems:  As per HPI- otherwise negative.   Physical Examination: Filed Vitals:   01/08/15 1020  BP: 124/78  Pulse: 100  Temp: 98.2 F (36.8 C)  Resp: 17   Filed Vitals:   01/08/15 1020  Height: 5' 10.5" (1.791 m)  Weight: 205 lb (92.987 kg)   Body mass index is 28.99 kg/(m^2). Ideal Body Weight: Weight in (lb) to have BMI = 25: 176.4  GEN: WDWN, NAD, Non-toxic, A & O x 3 HEENT: Atraumatic, Normocephalic. Neck supple. No masses, No LAD. Ears and Nose: No external deformity. CV: RRR, No M/G/R. No JVD.  No thrill. No extra heart sounds. PULM: CTA B, no wheezes, crackles, rhonchi. No retractions. No resp. distress. No accessory muscle use. ABD: S, NT, ND, +BS. No rebound. No HSM. EXTR: No c/c/e NEURO Normal gait.  PSYCH: Normally interactive. Conversant. Not depressed or anxious appearing.  Calm demeanor.  Gu: normal testes and penis, no perineal tenderness  Results for orders placed or performed in visit on 01/08/15  POCT urinalysis dipstick  Result Value Ref Range   Color, UA red (A) yellow   Clarity, UA cloudy (A) clear   Glucose, UA negative negative   Bilirubin, UA negative negative   Ketones, POC UA negative negative   Spec Grav, UA 1.015    Blood, UA large (A) negative   pH, UA 6.5    Protein Ur, POC =100 (A) negative   Urobilinogen, UA 0.2    Nitrite, UA Negative Negative   Leukocytes, UA Negative Negative  POCT Microscopic Urinalysis ([facility])  Result Value Ref Range   WBC,UR,HPF,POC None None WBC/hpf   RBC,UR,HPF,POC Too numerous to count  (A) None RBC/hpf   Bacteria Few (A) None, Too numerous to count   Mucus Absent Absent   Epithelial Cells, UR Per Microscopy None None, Too numerous to count cells/hpf  POCT CBC  Result Value Ref Range   WBC 5.9 4.6 - 10.2 K/uL   Lymph, poc 1.5 0.6 - 3.4   POC LYMPH PERCENT 24.7 10 - 50 %L   MID (cbc) 1.0 (A) 0 - 0.9   POC MID % 17.5 (A) 0 - 12 %M   POC Granulocyte 3.4 2 - 6.9   Granulocyte percent 57.8 37 - 80 %G   RBC 5.02 4.69 - 6.13 M/uL   Hemoglobin 14.6 14.1 - 18.1 g/dL   HCT, POC 57.8 (A) 46.9 - 53.7 %   MCV 85.3 80 - 97 fL   MCH, POC 29.2 27 - 31.2 pg   MCHC 34.2 31.8 - 35.4 g/dL   RDW, POC 62.9 %   Platelet Count, POC 131 (A) 142 - 424 K/uL   MPV 8.9 0 - 99.8 fL     Assessment and Plan: Gross hematuria - Plan: POCT urinalysis dipstick, POCT Microscopic Urinalysis ([facility]), Urine culture, Ambulatory referral to Urology, POCT CBC   Referral to urology for subacute gross hematuria. No pain or other sx- however  advised pt that urology follow-up is essential and he will let me know if he does not receive an appt soon Otherwise he will let us know  if any change in his sx CBC is stable- he does not appear to have lost any significant blood   Signed Abbe AmsterdamJessica Tylor Gambrill, MD  Received urine culture 1/3- it is negative.  Proceed with urology follow-up

## 2015-01-08 NOTE — Patient Instructions (Signed)
We are going to refer you to urology asap to evaluate the blood in your urine.  I did run a urine culture to make sure this is not an infection but at this time I do not think your bleeding is caused by infection  It is important to have this issue evaluated by urology so please alert me if you do not hear about your appointment soon!   Also let us know if you start to have any pain or any other change in your symptoms.

## 2015-01-09 ENCOUNTER — Encounter: Payer: Self-pay | Admitting: Family Medicine

## 2015-01-09 LAB — URINE CULTURE
Colony Count: NO GROWTH
ORGANISM ID, BACTERIA: NO GROWTH

## 2015-04-03 ENCOUNTER — Telehealth: Payer: Self-pay

## 2015-04-03 NOTE — Telephone Encounter (Signed)
Pt states the pharmacy have been trying to get his XARELTO 20 MG TABS refilled but hasn't heard from us, didn't see in system where the pharmacy have tried calling Please call [phone]     [facility] [place]

## 2015-04-04 ENCOUNTER — Other Ambulatory Visit: Payer: Self-pay

## 2015-04-04 MED ORDER — RIVAROXABAN 20 MG PO TABS: 20.0000 mg | ORAL_TABLET | Freq: Every day | ORAL | Status: DC

## 2015-04-04 NOTE — Telephone Encounter (Signed)
It is not clear to me who is managing his Xarelto. It appears that Dr. Dareen PianoAnderson was seeing him most consistently. Epic is not allowing me to refill because of a future pending order for the same. In any case, he hasn't had labs done recently to monitor his coagulopathy. He might just be better off coming in if he is completely out. Patient may also need to look to establish care. Unfortunately, we cannot sustain our patient work load given that we are transitioning between retiring physicians and hiring new ones. Thank you!

## 2015-04-04 NOTE — Telephone Encounter (Signed)
Advise on refill. 

## 2015-05-04 DIAGNOSIS — Z0000 Encounter for general adult medical examination without abnormal findings: Secondary | ICD-10-CM | POA: Diagnosis not present

## 2015-05-04 DIAGNOSIS — R310 Gross hematuria: Secondary | ICD-10-CM | POA: Diagnosis not present

## 2015-05-15 DIAGNOSIS — K8020 Calculus of gallbladder without cholecystitis without obstruction: Secondary | ICD-10-CM | POA: Diagnosis not present

## 2015-05-15 DIAGNOSIS — R310 Gross hematuria: Secondary | ICD-10-CM | POA: Diagnosis not present

## 2015-05-21 DIAGNOSIS — R310 Gross hematuria: Secondary | ICD-10-CM | POA: Diagnosis not present

## 2015-05-21 DIAGNOSIS — Z0000 Encounter for general adult medical examination without abnormal findings: Secondary | ICD-10-CM | POA: Diagnosis not present

## 2015-05-31 ENCOUNTER — Other Ambulatory Visit: Payer: Self-pay | Admitting: Urgent Care

## 2015-05-31 ENCOUNTER — Other Ambulatory Visit: Payer: Self-pay

## 2015-05-31 MED ORDER — ATORVASTATIN CALCIUM 40 MG PO TABS: 40.0000 mg | ORAL_TABLET | Freq: Every day | ORAL | Status: DC

## 2015-10-10 ENCOUNTER — Other Ambulatory Visit: Payer: Self-pay | Admitting: Urgent Care

## 2015-10-16 NOTE — Telephone Encounter (Signed)
10/2014 last exam and labs Advised needs ov

## 2015-10-20 ENCOUNTER — Telehealth: Payer: Self-pay | Admitting: *Deleted

## 2015-10-20 ENCOUNTER — Ambulatory Visit (INDEPENDENT_AMBULATORY_CARE_PROVIDER_SITE_OTHER): Payer: Federal, State, Local not specified - PPO | Admitting: Physician Assistant

## 2015-10-20 VITALS — BP 118/80 | HR 61 | Temp 97.9°F | Resp 17 | Ht 70.5 in | Wt 199.0 lb

## 2015-10-20 DIAGNOSIS — Z13228 Encounter for screening for other metabolic disorders: Secondary | ICD-10-CM | POA: Diagnosis not present

## 2015-10-20 DIAGNOSIS — Z1329 Encounter for screening for other suspected endocrine disorder: Secondary | ICD-10-CM

## 2015-10-20 DIAGNOSIS — E782 Mixed hyperlipidemia: Secondary | ICD-10-CM

## 2015-10-20 DIAGNOSIS — Z130 Encounter for screening for diseases of the blood and blood-forming organs and certain disorders involving the immune mechanism: Secondary | ICD-10-CM | POA: Diagnosis not present

## 2015-10-20 DIAGNOSIS — Z113 Encounter for screening for infections with a predominantly sexual mode of transmission: Secondary | ICD-10-CM | POA: Diagnosis not present

## 2015-10-20 DIAGNOSIS — Z23 Encounter for immunization: Secondary | ICD-10-CM | POA: Diagnosis not present

## 2015-10-20 DIAGNOSIS — Z125 Encounter for screening for malignant neoplasm of prostate: Secondary | ICD-10-CM | POA: Diagnosis not present

## 2015-10-20 DIAGNOSIS — Z0000 Encounter for general adult medical examination without abnormal findings: Secondary | ICD-10-CM | POA: Diagnosis not present

## 2015-10-20 LAB — CBC WITH DIFFERENTIAL/PLATELET
BASOS PCT: 0 %
Basophils Absolute: 0 cells/uL (ref 0–200)
EOS PCT: 5 %
Eosinophils Absolute: 225 cells/uL (ref 15–500)
HCT: 43.6 % (ref 38.5–50.0)
HEMOGLOBIN: 14.9 g/dL (ref 13.2–17.1)
LYMPHS ABS: 990 {cells}/uL (ref 850–3900)
Lymphocytes Relative: 22 %
MCH: 29.2 pg (ref 27.0–33.0)
MCHC: 34.2 g/dL (ref 32.0–36.0)
MCV: 85.5 fL (ref 80.0–100.0)
MPV: 11.4 fL (ref 7.5–12.5)
Monocytes Absolute: 450 cells/uL (ref 200–950)
Monocytes Relative: 10 %
NEUTROS ABS: 2835 {cells}/uL (ref 1500–7800)
Neutrophils Relative %: 63 %
Platelets: 162 10*3/uL (ref 140–400)
RBC: 5.1 MIL/uL (ref 4.20–5.80)
RDW: 13.6 % (ref 11.0–15.0)
WBC: 4.5 10*3/uL (ref 3.8–10.8)

## 2015-10-20 LAB — COMPLETE METABOLIC PANEL WITH GFR
ALBUMIN: 4.1 g/dL (ref 3.6–5.1)
ALK PHOS: 80 U/L (ref 40–115)
ALT: 14 U/L (ref 9–46)
AST: 18 U/L (ref 10–35)
BUN: 16 mg/dL (ref 7–25)
CO2: 29 mmol/L (ref 20–31)
Calcium: 9.3 mg/dL (ref 8.6–10.3)
Chloride: 103 mmol/L (ref 98–110)
Creat: 1.19 mg/dL (ref 0.70–1.33)
GFR, Est African American: 80 mL/min (ref >=60)
GFR, Est Non African American: 69 mL/min (ref >=60)
GLUCOSE: 101 mg/dL — AB (ref 65–99)
POTASSIUM: 4.4 mmol/L (ref 3.5–5.3)
SODIUM: 139 mmol/L (ref 135–146)
TOTAL PROTEIN: 7.7 g/dL (ref 6.1–8.1)
Total Bilirubin: 0.8 mg/dL (ref 0.2–1.2)

## 2015-10-20 LAB — POCT URINALYSIS DIP (MANUAL ENTRY)
BILIRUBIN UA: NEGATIVE
BILIRUBIN UA: NEGATIVE
Glucose, UA: NEGATIVE
NITRITE UA: NEGATIVE
PH UA: 6.5
Protein Ur, POC: NEGATIVE
RBC UA: NEGATIVE
SPEC GRAV UA: 1.015
Urobilinogen, UA: 0.2

## 2015-10-20 LAB — LIPID PANEL
CHOL/HDL RATIO: 5.4 ratio — AB (ref <=5.0)
Cholesterol: 236 mg/dL — ABNORMAL HIGH (ref 125–200)
HDL: 44 mg/dL (ref >=40)
LDL Cholesterol: 166 mg/dL — ABNORMAL HIGH (ref <130)
Triglycerides: 132 mg/dL (ref <150)
VLDL: 26 mg/dL (ref <30)

## 2015-10-20 LAB — HIV ANTIBODY (ROUTINE TESTING W REFLEX): HIV: NONREACTIVE

## 2015-10-20 LAB — TSH: TSH: 1.51 m[IU]/L (ref 0.40–4.50)

## 2015-10-20 LAB — PSA: PSA: 1.7 ng/mL (ref <=4.0)

## 2015-10-20 NOTE — Progress Notes (Signed)
Patient ID: Ryan Gutierrez, male   DOB: 11-15-1960, 55 y.o.   MRN: [id number] [facility] [address], [place] [address] [phone]  By signing my name below, I, [name], attest that this documentation has been prepared under the direction and in the presence of Trena Platt, PA-C Electronically Signed: Charline Bills, ED Scribe 10/20/2015 at 8:06 AM.  Date:  10/20/2015   Name:  Ryan Gutierrez   DOB:  October 08, 1960   MRN:  [id number]  PCP:  No PCP Per Patient   History of Present Illness:  Ryan Gutierrez is a 55 y.o. male patient who presents to [facility] for an annual exam. Pt's last annual was September 2016.   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 He is eating a lot of vegetables. Pt states that he drinks close to 64 oz of water daily and 2 Cokes or [employer] daily. He also consumes 1-1.5 cup of coffee daily.  Urination: Pt states that he followed up with urology for hematuria and was released. He denies hematuria since. Pt denies dysuria, urinary frequency, weakened stream. BM: Pt reports occasional constipation and diarrhea. He states that he has seen blood in stools in the past but this is rare. He denies melena. Pt had a normal colonoscopy.  Sleep: He denies insomnia. Pt reports 4-5 hours of sleep/night.  Social Activity: Pt goes to the movies for fun. He does a lot of yard work but denies any other exercising. Pt denies h/o tobacco, alcohol or illicit drug use.   Pt reports intermittent left leg swelling that he notices after prolonged standing. He states that he first noticed left leg swelling since his DVT in 2003. Pt does not currently wear compression stockings. He is still taking Xarelto. Pt denies chest pain, palpitations, sob.   Patient Active Problem List   Diagnosis Date Noted   Mixed hyperlipidemia 05/26/2013   Other pulmonary embolism and infarction 05/26/2013    Long term (current) use of anticoagulants 05/26/2013   Hematochezia 11/05/2011   Recurrent deep vein thrombosis (HCC) 10/24/2011    Past Medical History:  Diagnosis Date   Clotting disorder (HCC)    DVT (deep venous thrombosis) (HCC)    post-knee surgery   Hyperlipidemia    Sickle cell trait (HCC)     Past Surgical History:  Procedure Laterality Date   HERNIA REPAIR     KNEE SURGERY  1990s    Social History  Substance Use Topics   Smoking status: Never Smoker   Smokeless tobacco: Never Used   Alcohol use No    Family History  Problem Relation Age of Onset   Heart disease Mother    Heart attack Mother    Diabetes Sister    Hypertension Sister    Sickle cell anemia Son     No Known Allergies  Medication list has been reviewed and updated.  Current Outpatient Prescriptions on File Prior to Visit  Medication Sig Dispense Refill   XARELTO 20 MG TABS tablet TAKE 1 TABLET(20 MG) BY MOUTH DAILY WITH SUPPER 90 tablet 0   atorvastatin (LIPITOR) 40 MG tablet Take 1 tablet (40 mg total) by mouth daily. (Patient not taking: Reported on 10/20/2015) 30 tablet 0   ciprofloxacin (CIPRO) 500 MG tablet Take 1 tablet (500 mg total) by mouth 2 (two) times daily. (Patient not taking: Reported on 10/20/2015) 30 tablet 0  No current facility-administered medications on file prior to visit.     Review of Systems  Respiratory: Negative for shortness of breath.   Cardiovascular: Positive for leg swelling. Negative for chest pain and palpitations.  Gastrointestinal: Positive for constipation (intermittent) and diarrhea (intermittent).  Genitourinary: Negative for dysuria, frequency and hematuria.  Endo/Heme/Allergies: Bruises/bleeds easily.  Psychiatric/Behavioral: The patient does not have insomnia.     Physical Examination: BP 118/80 (BP Location: Right Arm, Patient Position: Sitting, Cuff Size: Large)    Pulse 61    Temp 97.9 F (36.6 C) (Oral)    Resp 17     Ht 5' 10.5" (1.791 m)    Wt 199 lb (90.3 kg)    SpO2 98%    BMI 28.15 kg/m  Ideal Body Weight: @FLOWAMB ([id number])@[phone])@  Physical Exam  Constitutional: He is oriented to person, place, and time. He appears well-developed and well-nourished. No distress.  HENT:  Head: Normocephalic and atraumatic.  Right Ear: Tympanic membrane, external ear and ear canal normal.  Left Ear: Tympanic membrane, external ear and ear canal normal.  Eyes: Conjunctivae and EOM are normal. Pupils are equal, round, and reactive to light.  Cardiovascular: Normal rate and regular rhythm.  Exam reveals no friction rub.   No murmur heard. Pulmonary/Chest: Effort normal. No respiratory distress. He has no wheezes.  Abdominal: Soft. Bowel sounds are normal. He exhibits no distension and no mass. There is no tenderness.  Genitourinary:  Genitourinary Comments: Chaperone present. Slightly enlarged prostate but no nodules. Prostate felt boggy.   Musculoskeletal: Normal range of motion. He exhibits no edema or tenderness.  Neurological: He is alert and oriented to person, place, and time. He displays normal reflexes.  Skin: Skin is warm and dry. He is not diaphoretic.  Psychiatric: He has a normal mood and affect. His behavior is normal.    Assessment and Plan: Ryan Gutierrez is a 55 y.o. male who is here today for annual physical. --flu vaccine given today. --colonoscopy utd. --psa obtained today.  Annual physical exam - Plan: CBC with Differential/Platelet, COMPLETE METABOLIC PANEL WITH GFR, Lipid panel, TSH, PSA, Flu Vaccine QUAD 36+ mos IM, POCT urinalysis dipstick, GC/Chlamydia Probe Amp, RPR, HIV antibody  Screening for thyroid disorder - Plan: TSH  Screening for metabolic disorder - Plan: COMPLETE METABOLIC PANEL WITH GFR  Screening for prostate cancer - Plan: PSA  Mixed hyperlipidemia - Plan: Lipid panel  Screening for deficiency anemia - Plan: CBC with Differential/Platelet  Need for prophylactic vaccination  and inoculation against influenza - Plan: Flu Vaccine QUAD 36+ mos IM  Screening for STD (sexually transmitted disease) - Plan: GC/Chlamydia Probe Amp, RPR, HIV antibody Trena PlattStephanie English, PA-C [facility] 10/20/2015 8:06 AM  I personally performed the services described in this documentation, which was scribed in my presence. The recorded information has been reviewed and is accurate.

## 2015-10-20 NOTE — Telephone Encounter (Signed)
Called patient advised Lipitor and Xarelto will be refilled for 30 days. Make an appointment to return before you run out of medication.

## 2015-10-20 NOTE — Patient Instructions (Addendum)
Please make sure that you are hydrating with 64 ounces of water if not more. Keeping you healthy  Get these tests  Blood pressure- Have your blood pressure checked once a year by your healthcare Monique Hefty.  Normal blood pressure is 120/80  Weight- Have your body mass index (BMI) calculated to screen for obesity.  BMI is a measure of body fat based on height and weight. You can also calculate your own BMI at [email or web address].  Cholesterol- Have your cholesterol checked every year.  Diabetes- Have your blood sugar checked regularly if you have high blood pressure, high cholesterol, have a family history of diabetes or if you are overweight.  Screening for Colon Cancer- Colonoscopy starting at age 53.  Screening may begin sooner depending on your family history and other health conditions. Follow up colonoscopy as directed by your Gastroenterologist.  Screening for Prostate Cancer- Both blood work (PSA) and a rectal exam help screen for Prostate Cancer.  Screening begins at age 30 with African-American men and at age 53 with Caucasian men.  Screening may begin sooner depending on your family history.  Take these medicines  Aspirin- One aspirin daily can help prevent Heart disease and Stroke.  Flu shot- Every fall.  Tetanus- Every 10 years.  Zostavax- Once after the age of 2 to prevent Shingles.  Pneumonia shot- Once after the age of 53; if you are younger than 26, ask your healthcare Alejandra Barna if you need a Pneumonia shot.  Take these steps  Don't smoke- If you do smoke, talk to your doctor about quitting.  For tips on how to quit, go to www.smokefree.gov or call 1-800-QUIT-NOW.  Be physically active- Exercise 5 days a week for at least 30 minutes.  If you are not already physically active start slow and gradually work up to 30 minutes of moderate physical activity.  Examples of moderate activity include walking briskly, mowing the yard, dancing, swimming, bicycling, etc.  Eat  a healthy diet- Eat a variety of healthy food such as fruits, vegetables, low fat milk, low fat cheese, yogurt, lean meant, poultry, fish, beans, tofu, etc. For more information go to www.thenutritionsource.org  Drink alcohol in moderation- Limit alcohol intake to less than two drinks a day. Never drink and drive.  Dentist- Brush and floss twice daily; visit your dentist twice a year.  Depression- Your emotional health is as important as your physical health. If you're feeling down, or losing interest in things you would normally enjoy please talk to your healthcare Teyah Rossy.  Eye exam- Visit your eye doctor every year.  Safe sex- If you may be exposed to a sexually transmitted infection, use a condom.  Seat belts- Seat belts can save your life; always wear one.  Smoke/Carbon Monoxide detectors- These detectors need to be installed on the appropriate level of your home.  Replace batteries at least once a year.  Skin cancer- When out in the sun, cover up and use sunscreen 15 SPF or higher.  Violence- If anyone is threatening you, please tell your healthcare Burrell Hodapp.  Living Will/ Health care power of attorney- Speak with your healthcare Kimani Bedoya and family.     IF you received an x-ray today, you will receive an invoice from [facility]. Please contact [facility] at [phone] with questions or concerns regarding your invoice.   IF you received labwork today, you will receive an invoice from [employer]. Please contact [facility] at [phone] with questions or concerns regarding your invoice.   Our billing  staff will not be able to assist you with questions regarding bills from these companies.  You will be contacted with the lab results as soon as they are available. The fastest way to get your results is to activate your My Chart account. Instructions are located on the last page of this paperwork. If you have not heard from us  regarding the results in 2 weeks, please contact this office.

## 2015-10-23 LAB — GC/CHLAMYDIA PROBE AMP
CT Probe RNA: NOT DETECTED
GC Probe RNA: NOT DETECTED

## 2015-10-23 LAB — RPR

## 2015-11-15 MED ORDER — ATORVASTATIN CALCIUM 40 MG PO TABS: 40.0000 mg | ORAL_TABLET | Freq: Every day | ORAL | 5 refills | Status: DC

## 2015-11-15 NOTE — Addendum Note (Signed)
Addended by: Cydney OkAUGUSTIN, Shaye Elling N on: 11/15/2015 11:28 AM   Modules accepted: Orders

## 2016-01-11 ENCOUNTER — Other Ambulatory Visit: Payer: Self-pay | Admitting: Physician Assistant

## 2016-04-20 ENCOUNTER — Other Ambulatory Visit: Payer: Self-pay | Admitting: Physician Assistant

## 2016-06-02 ENCOUNTER — Other Ambulatory Visit: Payer: Self-pay | Admitting: Physician Assistant

## 2016-06-03 NOTE — Telephone Encounter (Signed)
10/2015 last ov 

## 2016-06-04 NOTE — Telephone Encounter (Signed)
Refill.  Advise patient to make appointment prior to running out of medication

## 2016-07-07 ENCOUNTER — Telehealth: Payer: Self-pay | Admitting: Physician Assistant

## 2016-07-14 NOTE — Telephone Encounter (Signed)
Appt scheduled for 8/6 please advise.

## 2016-07-14 NOTE — Telephone Encounter (Signed)
Pt states that someone called him and told him the medication will be called in but it has not been he has made an up coming appt to be seen like he was asked   Best number [phone]

## 2016-07-15 MED ORDER — RIVAROXABAN 20 MG PO TABS: ORAL_TABLET | ORAL | 0 refills | Status: DC

## 2016-08-11 ENCOUNTER — Ambulatory Visit (INDEPENDENT_AMBULATORY_CARE_PROVIDER_SITE_OTHER): Payer: Federal, State, Local not specified - PPO | Admitting: Physician Assistant

## 2016-08-11 ENCOUNTER — Encounter: Payer: Self-pay | Admitting: Physician Assistant

## 2016-08-11 VITALS — BP 132/77 | HR 71 | Temp 97.9°F | Resp 16 | Ht 70.0 in | Wt 200.0 lb

## 2016-08-11 DIAGNOSIS — E785 Hyperlipidemia, unspecified: Secondary | ICD-10-CM | POA: Diagnosis not present

## 2016-08-11 DIAGNOSIS — Z7901 Long term (current) use of anticoagulants: Secondary | ICD-10-CM | POA: Diagnosis not present

## 2016-08-11 DIAGNOSIS — Z86711 Personal history of pulmonary embolism: Secondary | ICD-10-CM | POA: Diagnosis not present

## 2016-08-11 MED ORDER — RIVAROXABAN 20 MG PO TABS: ORAL_TABLET | ORAL | 0 refills | Status: DC

## 2016-08-11 MED ORDER — ATORVASTATIN CALCIUM 40 MG PO TABS: 40.0000 mg | ORAL_TABLET | Freq: Every day | ORAL | 1 refills | Status: DC

## 2016-08-11 NOTE — Patient Instructions (Addendum)
Fat and Cholesterol Restricted Diet Getting too much fat and cholesterol in your diet may cause health problems. Following this diet helps keep your fat and cholesterol at normal levels. This can keep you from getting sick. What types of fat should I choose?  Choose monosaturated and polyunsaturated fats. These are found in foods such as olive oil, canola oil, flaxseeds, walnuts, almonds, and seeds.  Eat more omega-3 fats. Good choices include salmon, mackerel, sardines, tuna, flaxseed oil, and ground flaxseeds.  Limit saturated fats. These are in animal products such as meats, butter, and cream. They can also be in plant products such as palm oil, palm kernel oil, and coconut oil.  Avoid foods with partially hydrogenated oils in them. These contain trans fats. Examples of foods that have trans fats are stick margarine, some tub margarines, cookies, crackers, and other baked goods. What general guidelines do I need to follow?  Check food labels. Look for the words "trans fat" and "saturated fat."  When preparing a meal: ? Fill half of your plate with vegetables and green salads. ? Fill one fourth of your plate with whole grains. Look for the word "whole" as the first word in the ingredient list. ? Fill one fourth of your plate with lean protein foods.  Eat more foods that have fiber, like apples, carrots, beans, peas, and barley.  Eat more home-cooked foods. Eat less at restaurants and buffets.  Limit or avoid alcohol.  Limit foods high in starch and sugar.  Limit fried foods.  Cook foods without frying them. Baking, boiling, grilling, and broiling are all great options.  Lose weight if you are overweight. Losing even a small amount of weight can help your overall health. It can also help prevent diseases such as diabetes and heart disease. What foods can I eat? Grains Whole grains, such as whole wheat or whole grain breads, crackers, cereals, and pasta. Unsweetened oatmeal,  bulgur, barley, quinoa, or brown rice. Corn or whole wheat flour tortillas. Vegetables Fresh or frozen vegetables (raw, steamed, roasted, or grilled). Green salads. Fruits All fresh, canned (in natural juice), or frozen fruits. Meat and Other Protein Products Ground beef (85% or leaner), grass-fed beef, or beef trimmed of fat. Skinless chicken or turkey. Ground chicken or turkey. Pork trimmed of fat. All fish and seafood. Eggs. Dried beans, peas, or lentils. Unsalted nuts or seeds. Unsalted canned or dry beans. Dairy Low-fat dairy products, such as skim or 1% milk, 2% or reduced-fat cheeses, low-fat ricotta or cottage cheese, or plain low-fat yogurt. Fats and Oils Tub margarines without trans fats. Light or reduced-fat mayonnaise and salad dressings. Avocado. Olive, canola, sesame, or safflower oils. Natural peanut or almond butter (choose ones without added sugar and oil). The items listed above may not be a complete list of recommended foods or beverages. Contact your dietitian for more options. What foods are not recommended? Grains White bread. White pasta. White rice. Cornbread. Bagels, pastries, and croissants. Crackers that contain trans fat. Vegetables White potatoes. Corn. Creamed or fried vegetables. Vegetables in a cheese sauce. Fruits Dried fruits. Canned fruit in light or heavy syrup. Fruit juice. Meat and Other Protein Products Fatty cuts of meat. Ribs, chicken wings, bacon, sausage, bologna, salami, chitterlings, fatback, hot dogs, bratwurst, and packaged luncheon meats. Liver and organ meats. Dairy Whole or 2% milk, cream, half-and-half, and cream cheese. Whole milk cheeses. Whole-fat or sweetened yogurt. Full-fat cheeses. Nondairy creamers and whipped toppings. Processed cheese, cheese spreads, or cheese curds. Sweets and Desserts Corn   syrup, sugars, honey, and molasses. Candy. Jam and jelly. Syrup. Sweetened cereals. Cookies, pies, cakes, donuts, muffins, and ice  cream. Fats and Oils Butter, stick margarine, lard, shortening, ghee, or bacon fat. Coconut, palm kernel, or palm oils. Beverages Alcohol. Sweetened drinks (such as sodas, lemonade, and fruit drinks or punches). The items listed above may not be a complete list of foods and beverages to avoid. Contact your dietitian for more information. This information is not intended to replace advice given to you by your health care provider. Make sure you discuss any questions you have with your health care provider. Document Released: 06/24/2011 Document Revised: 08/30/2015 Document Reviewed: 03/24/2013 Elsevier Interactive Patient Education  2018 Elsevier Inc.     IF you received an x-ray today, you will receive an invoice from [facility]. Please contact [facility] at [phone] with questions or concerns regarding your invoice.   IF you received labwork today, you will receive an invoice from [facility]. Please contact [facility] at [phone] with questions or concerns regarding your invoice.   Our billing staff will not be able to assist you with questions regarding bills from these companies.  You will be contacted with the lab results as soon as they are available. The fastest way to get your results is to activate your My Chart account. Instructions are located on the last page of this paperwork. If you have not heard from us regarding the results in 2 weeks, please contact this office.      

## 2016-08-11 NOTE — Progress Notes (Signed)
PRIMARY CARE AT [facility] [address] [place] [address] [phone]  Date:  08/11/2016   Name:  Ryan Gutierrez   DOB:  Sep 20, 1960   MRN:  [id number]  PCP:  Patient, No Pcp Per    History of Present Illness:  Ryan Gutierrez is a 56 y.o. male patient who presents to PCP with  Chief Complaint  Patient presents with  . Medication Refill    xarelto/ pt came in for check up to get refills     He is eating mostly baked.  They are doing blue apron for the last 3 months.  He enjoys this which was vegetables.  No chest pains or palpitations.  He is doing a lot of gardening.  He is not doing anything out with exercise.  He will do 5 yards per day, with lawn mower walking, which he notes to be his daily exercise. No sob, chest pains, palpitations,  Or leg swellings.    Patient Active Problem List   Diagnosis Date Noted  . Mixed hyperlipidemia 05/26/2013  . Other pulmonary embolism and infarction 05/26/2013  . Long term (current) use of anticoagulants 05/26/2013  . Hematochezia 11/05/2011  . Recurrent deep vein thrombosis (HCC) 10/24/2011    Past Medical History:  Diagnosis Date  . Clotting disorder (HCC)   . DVT (deep venous thrombosis) (HCC)    post-knee surgery  . Hyperlipidemia   . Sickle cell trait [facility])     Past Surgical History:  Procedure Laterality Date  . HERNIA REPAIR    . KNEE SURGERY  1990s    Social History  Substance Use Topics  . Smoking status: Never Smoker  . Smokeless tobacco: Never Used  . Alcohol use No    Family History  Problem Relation Age of Onset  . Heart disease Mother   . Heart attack Mother   . Diabetes Sister   . Hypertension Sister   . Sickle cell anemia Son     No Known Allergies  Medication list has been reviewed and updated.  Current Outpatient Prescriptions on File Prior to Visit  Medication Sig Dispense Refill  . rivaroxaban (XARELTO) 20 MG TABS tablet TAKE 1 TABLET BY MOUTH DAILY WITH SUPPER.  Need office visit prior to  refill 90 tablet 0  . atorvastatin (LIPITOR) 40 MG tablet Take 1 tablet (40 mg total) by mouth daily. (Patient not taking: Reported on 08/11/2016) 30 tablet 5   No current facility-administered medications on file prior to visit.     ROS ROS otherwise unremarkable unless listed above.  Physical Examination: BP 132/77   Pulse 71   Temp 97.9 F (36.6 C) (Oral)   Resp 16   Ht 5\' 10"  (1.778 m)   Wt 200 lb (90.7 kg)   SpO2 98%   BMI 28.70 kg/m  Ideal Body Weight: Weight in (lb) to have BMI = 25: 173.9  Physical Exam  Constitutional: He is oriented to person, place, and time. He appears well-developed and well-nourished. No distress.  HENT:  Head: Normocephalic and atraumatic.  Eyes: Pupils are equal, round, and reactive to light. Conjunctivae and EOM are normal.  Cardiovascular: Normal rate.   Pulmonary/Chest: Effort normal. No respiratory distress.  Neurological: He is alert and oriented to person, place, and time.  Skin: Skin is warm and dry. He is not diaphoretic.  Psychiatric: He has a normal mood and affect. His behavior is normal.     Assessment and Plan: Ryan Gutierrez is a 56 y.o. male who is  here today for cc of medication refill of xarelto. Given cholesterol restricted diet. Long term (current) use of anticoagulants - Plan: rivaroxaban (XARELTO) 20 MG TABS tablet  Hx of pulmonary embolus - Plan: rivaroxaban (XARELTO) 20 MG TABS tablet  Hyperlipidemia, unspecified hyperlipidemia type - Plan: atorvastatin (LIPITOR) 40 MG tablet  Trena PlattStephanie Mercades Bajaj, PA-C [facility] 8/12/201810:51 AM

## 2016-10-27 ENCOUNTER — Other Ambulatory Visit: Payer: Self-pay | Admitting: Physician Assistant

## 2016-10-27 ENCOUNTER — Ambulatory Visit (INDEPENDENT_AMBULATORY_CARE_PROVIDER_SITE_OTHER): Payer: Federal, State, Local not specified - PPO | Admitting: Physician Assistant

## 2016-10-27 ENCOUNTER — Encounter: Payer: Self-pay | Admitting: Physician Assistant

## 2016-10-27 VITALS — BP 122/60 | HR 68 | Temp 98.5°F | Resp 16 | Ht 70.0 in | Wt 195.0 lb

## 2016-10-27 DIAGNOSIS — Z86711 Personal history of pulmonary embolism: Secondary | ICD-10-CM | POA: Diagnosis not present

## 2016-10-27 DIAGNOSIS — Z1329 Encounter for screening for other suspected endocrine disorder: Secondary | ICD-10-CM | POA: Diagnosis not present

## 2016-10-27 DIAGNOSIS — Z0000 Encounter for general adult medical examination without abnormal findings: Secondary | ICD-10-CM | POA: Diagnosis not present

## 2016-10-27 DIAGNOSIS — Z130 Encounter for screening for diseases of the blood and blood-forming organs and certain disorders involving the immune mechanism: Secondary | ICD-10-CM

## 2016-10-27 DIAGNOSIS — R600 Localized edema: Secondary | ICD-10-CM | POA: Diagnosis not present

## 2016-10-27 DIAGNOSIS — Z23 Encounter for immunization: Secondary | ICD-10-CM | POA: Diagnosis not present

## 2016-10-27 DIAGNOSIS — Z13220 Encounter for screening for lipoid disorders: Secondary | ICD-10-CM

## 2016-10-27 DIAGNOSIS — Z125 Encounter for screening for malignant neoplasm of prostate: Secondary | ICD-10-CM

## 2016-10-27 DIAGNOSIS — Z13228 Encounter for screening for other metabolic disorders: Secondary | ICD-10-CM

## 2016-10-27 DIAGNOSIS — E785 Hyperlipidemia, unspecified: Secondary | ICD-10-CM | POA: Diagnosis not present

## 2016-10-27 DIAGNOSIS — Z7901 Long term (current) use of anticoagulants: Secondary | ICD-10-CM | POA: Diagnosis not present

## 2016-10-27 DIAGNOSIS — Z113 Encounter for screening for infections with a predominantly sexual mode of transmission: Secondary | ICD-10-CM

## 2016-10-27 MED ORDER — ATORVASTATIN CALCIUM 40 MG PO TABS: 40.0000 mg | ORAL_TABLET | Freq: Every day | ORAL | 3 refills | Status: DC

## 2016-10-27 MED ORDER — RIVAROXABAN 20 MG PO TABS: ORAL_TABLET | ORAL | 1 refills | Status: DC

## 2016-10-27 NOTE — Progress Notes (Signed)
PRIMARY CARE AT [facility] [address], [place] [address] [phone]  Date:  10/27/2016   Name:  Ryan Gutierrez   DOB:  Apr 11, 1960   MRN:  [id number]  PCP:  Patient, No Pcp Per    History of Present Illness:  Ryan Gutierrez is a 56 y.o. male patient who presents to PCP with  Chief Complaint  Patient presents with  . Annual Exam     DIET: He continues to have baked foods, but has fried foods as he travelled.  He cut back some with carbonated beverages.  1-2 cups of soda.  He drinks 64-80oz  BM: little constipation or diarrhea.  Little bloody stool.    URINATION: no hematuria in the last year.  History of hematuria.  Followed by urology and released.  No dysuria or weakened stream  SLEEP: 4-5 hours per night.    SOCIAL ACTIVITY He is exercising with cutting 3-4 yards but not much intentional exercise.  No tobacco use or illicit drug.   Dinner wine very rare.   History of dvt at the left lower extremity.  Consistently takes the xarelto.  He denies any se with the medications.  Continues to have lower extremity edema at the left leg since the dvt.  This will improve over night, but within hours of ambulation, the swelling returns.  This is not painful for him.   Wt Readings from Last 3 Encounters:  10/27/16 195 lb (88.5 kg)  08/11/16 200 lb (90.7 kg)  10/20/15 199 lb (90.3 kg)   Patient Active Problem List   Diagnosis Date Noted  . Mixed hyperlipidemia 05/26/2013  . Other pulmonary embolism and infarction 05/26/2013  . Long term (current) use of anticoagulants 05/26/2013  . Hematochezia 11/05/2011  . Recurrent deep vein thrombosis ([place]) 10/24/2011    Past Medical History:  Diagnosis Date  . Clotting disorder ([facility])   . DVT (deep venous thrombosis) ([place])    post-knee surgery  . Hyperlipidemia   . Sickle cell trait [facility])     Past Surgical History:  Procedure Laterality Date  . HERNIA REPAIR    . KNEE SURGERY  1990s    Social History  Substance Use Topics  .  Smoking status: Never Smoker  . Smokeless tobacco: Never Used  . Alcohol use No    Family History  Problem Relation Age of Onset  . Heart disease Mother   . Heart attack Mother   . Diabetes Sister   . Hypertension Sister   . Sickle cell anemia Son     No Known Allergies  Medication list has been reviewed and updated.  Current Outpatient Prescriptions on File Prior to Visit  Medication Sig Dispense Refill  . atorvastatin (LIPITOR) 40 MG tablet Take 1 tablet (40 mg total) by mouth daily. 90 tablet 1  . rivaroxaban (XARELTO) 20 MG TABS tablet TAKE 1 TABLET BY MOUTH DAILY WITH SUPPER. 90 tablet 0   No current facility-administered medications on file prior to visit.     Review of Systems  Constitutional: Negative for chills and fever.  HENT: Negative for ear discharge, ear pain and sore throat.   Eyes: Negative for blurred vision and double vision.  Respiratory: Negative for cough, shortness of breath and wheezing.   Cardiovascular: Negative for chest pain, palpitations and leg swelling.  Gastrointestinal: Negative for diarrhea, nausea and vomiting.  Genitourinary: Negative for dysuria, frequency and hematuria.  Skin: Negative for itching and rash.  Neurological: Negative for dizziness and headaches.   ROS  otherwise unremarkable unless listed above.  Physical Examination: BP 122/60   Pulse 68   Temp 98.5 F (36.9 C) (Oral)   Resp 16   Ht _0  (1.778 m)   Wt 195 lb (88.5 kg)   SpO2 98%   BMI 27.98 kg/m  Ideal Body Weight: Weight in (lb) to have BMI = 25: 173.9  Physical Exam  Constitutional: He is oriented to person, place, and time. He appears well-developed and well-nourished. No distress.  HENT:  Head: Normocephalic and atraumatic.  Right Ear: Tympanic membrane, external ear and ear canal normal.  Left Ear: Tympanic membrane, external ear and ear canal normal.  Eyes: Pupils are equal, round, and reactive to light. Conjunctivae and EOM are normal.   Cardiovascular: Normal rate and regular rhythm.  Exam reveals no friction rub.   No murmur heard. Pulses:      Dorsalis pedis pulses are 2+ on the right side, and 2+ on the left side.  Edematous pitting edema along the lower extremity 1+.  No tenderness or erythema  Pulmonary/Chest: Effort normal. No respiratory distress. He has no wheezes.  Abdominal: Soft. Bowel sounds are normal. He exhibits no distension and no mass. There is no tenderness.  Musculoskeletal: Normal range of motion. He exhibits no edema or tenderness.  Neurological: He is alert and oriented to person, place, and time. He displays normal reflexes.  Skin: Skin is warm and dry. He is not diaphoretic.  Psychiatric: He has a normal mood and affect. His behavior is normal.     Assessment and Plan: Ryan Gutierrez is a 56 y.o. male who is here today for cc of  Chief Complaint  Patient presents with  . Annual Exam   Annual physical exam - Plan: CBC, CMP14+EGFR, Lipid panel, PSA, TSH, GC/Chlamydia Probe Amp, HIV antibody, RPR  Need for influenza vaccination - Plan: Flu Vaccine QUAD 36+ mos IM  Hyperlipidemia, unspecified hyperlipidemia type - Plan: atorvastatin (LIPITOR) 40 MG tablet  Long term (current) use of anticoagulants - Plan: rivaroxaban (XARELTO) 20 MG TABS tablet  Hx of pulmonary embolus - Plan: rivaroxaban (XARELTO) 20 MG TABS tablet  Screening for STD (sexually transmitted disease) - Plan: GC/Chlamydia Probe Amp, HIV antibody, RPR  Screening for lipid disorders - Plan: Lipid panel  Screening for prostate cancer - Plan: PSA  Screening for thyroid disorder - Plan: TSH  Screening for metabolic disorder - Plan: CMP14+EGFR  Screening for deficiency anemia - Plan: CBC  Edema of left lower extremity - Plan: Ambulatory referral to Vascular Surgery  Ryan Drape, PA-C [facility] 10/22/20189:51 AM

## 2016-10-27 NOTE — Patient Instructions (Addendum)
Keeping you healthy  Get these tests  Blood pressure- Have your blood pressure checked once a year by your healthcare provider.  Normal blood pressure is 120/80  Weight- Have your body mass index (BMI) calculated to screen for obesity.  BMI is a measure of body fat based on height and weight. You can also calculate your own BMI at [email or web address].com/bmi/.  Cholesterol- Have your cholesterol checked every year.  Diabetes- Have your blood sugar checked regularly if you have high blood pressure, high cholesterol, have a family history of diabetes or if you are overweight.  Screening for Colon Cancer- Colonoscopy starting at age 50.  Screening may begin sooner depending on your family history and other health conditions. Follow up colonoscopy as directed by your Gastroenterologist.  Screening for Prostate Cancer- Both blood work (PSA) and a rectal exam help screen for Prostate Cancer.  Screening begins at age 40 with African-American men and at age 50 with Caucasian men.  Screening may begin sooner depending on your family history.  Take these medicines  Aspirin- One aspirin daily can help prevent Heart disease and Stroke.  Flu shot- Every fall.  Tetanus- Every 10 years.  Zostavax- Once after the age of 60 to prevent Shingles.  Pneumonia shot- Once after the age of 65; if you are younger than 65, ask your healthcare provider if you need a Pneumonia shot.  Take these steps  Don't smoke- If you do smoke, talk to your doctor about quitting.  For tips on how to quit, go to www.smokefree.gov or call 1-800-QUIT-NOW.  Be physically active- Exercise 5 days a week for at least 30 minutes.  If you are not already physically active start slow and gradually work up to 30 minutes of moderate physical activity.  Examples of moderate activity include walking briskly, mowing the yard, dancing, swimming, bicycling, etc.  Eat a healthy diet- Eat a variety of healthy food such as fruits, vegetables,  low fat milk, low fat cheese, yogurt, lean meant, poultry, fish, beans, tofu, etc. For more information go to www.thenutritionsource.org  Drink alcohol in moderation- Limit alcohol intake to less than two drinks a day. Never drink and drive.  Dentist- Brush and floss twice daily; visit your dentist twice a year.  Depression- Your emotional health is as important as your physical health. If you're feeling down, or losing interest in things you would normally enjoy please talk to your healthcare provider.  Eye exam- Visit your eye doctor every year.  Safe sex- If you may be exposed to a sexually transmitted infection, use a condom.  Seat belts- Seat belts can save your life; always wear one.  Smoke/Carbon Monoxide detectors- These detectors need to be installed on the appropriate level of your home.  Replace batteries at least once a year.  Skin cancer- When out in the sun, cover up and use sunscreen 15 SPF or higher.  Violence- If anyone is threatening you, please tell your healthcare provider.  Living Will/ Health care power of attorney- Speak with your healthcare provider and family.   IF you received an x-ray today, you will receive an invoice from [facility]. Please contact [facility] at [phone] with questions or concerns regarding your invoice.   IF you received labwork today, you will receive an invoice from [facility]. Please contact [facility] at [phone] with questions or concerns regarding your invoice.   Our billing staff will not be able to assist you with questions regarding bills from these companies.  You will be contacted   with the lab results as soon as they are available. The fastest way to get your results is to activate your My Chart account. Instructions are located on the last page of this paperwork. If you have not heard from us regarding the results in 2 weeks, please contact this office.     

## 2016-10-28 LAB — RPR: RPR: NONREACTIVE

## 2016-10-28 LAB — LIPID PANEL
CHOL/HDL RATIO: 2.8 ratio (ref 0.0–5.0)
Cholesterol, Total: 144 mg/dL (ref 100–199)
HDL: 51 mg/dL (ref >39)
LDL Calculated: 82 mg/dL (ref 0–99)
Triglycerides: 54 mg/dL (ref 0–149)
VLDL Cholesterol Cal: 11 mg/dL (ref 5–40)

## 2016-10-28 LAB — TSH: TSH: 2.45 u[IU]/mL (ref 0.450–4.500)

## 2016-10-28 LAB — CMP14+EGFR
A/G RATIO: 1.3 (ref 1.2–2.2)
ALBUMIN: 4.2 g/dL (ref 3.5–5.5)
ALT: 22 IU/L (ref 0–44)
AST: 18 IU/L (ref 0–40)
Alkaline Phosphatase: 113 IU/L (ref 39–117)
BUN / CREAT RATIO: 11 (ref 9–20)
BUN: 12 mg/dL (ref 6–24)
Bilirubin Total: 0.5 mg/dL (ref 0.0–1.2)
CALCIUM: 9.2 mg/dL (ref 8.7–10.2)
CO2: 27 mmol/L (ref 20–29)
Chloride: 100 mmol/L (ref 96–106)
Creatinine, Ser: 1.06 mg/dL (ref 0.76–1.27)
GFR, EST AFRICAN AMERICAN: 92 mL/min/{1.73_m2} (ref >59)
GFR, EST NON AFRICAN AMERICAN: 79 mL/min/{1.73_m2} (ref >59)
GLOBULIN, TOTAL: 3.3 g/dL (ref 1.5–4.5)
Glucose: 109 mg/dL — ABNORMAL HIGH (ref 65–99)
POTASSIUM: 4.1 mmol/L (ref 3.5–5.2)
SODIUM: 142 mmol/L (ref 134–144)
Total Protein: 7.5 g/dL (ref 6.0–8.5)

## 2016-10-28 LAB — PSA: Prostate Specific Ag, Serum: 1.6 ng/mL (ref 0.0–4.0)

## 2016-10-28 LAB — GC/CHLAMYDIA PROBE AMP
Chlamydia trachomatis, NAA: NEGATIVE
Neisseria gonorrhoeae by PCR: NEGATIVE

## 2016-10-28 LAB — CBC
HEMATOCRIT: 44.6 % (ref 37.5–51.0)
HEMOGLOBIN: 14.8 g/dL (ref 13.0–17.7)
MCH: 29.2 pg (ref 26.6–33.0)
MCHC: 33.2 g/dL (ref 31.5–35.7)
MCV: 88 fL (ref 79–97)
Platelets: 169 10*3/uL (ref 150–379)
RBC: 5.07 x10E6/uL (ref 4.14–5.80)
RDW: 14.4 % (ref 12.3–15.4)
WBC: 4.9 10*3/uL (ref 3.4–10.8)

## 2016-10-28 LAB — HIV ANTIBODY (ROUTINE TESTING W REFLEX): HIV SCREEN 4TH GENERATION: NONREACTIVE

## 2016-11-06 ENCOUNTER — Other Ambulatory Visit: Payer: Self-pay

## 2016-11-06 DIAGNOSIS — M7989 Other specified soft tissue disorders: Secondary | ICD-10-CM

## 2016-12-11 ENCOUNTER — Encounter: Payer: Federal, State, Local not specified - PPO | Admitting: Vascular Surgery

## 2016-12-11 ENCOUNTER — Encounter (HOSPITAL_COMMUNITY): Payer: Federal, State, Local not specified - PPO

## 2017-01-29 ENCOUNTER — Encounter: Payer: Self-pay | Admitting: Vascular Surgery

## 2017-01-29 ENCOUNTER — Other Ambulatory Visit: Payer: Self-pay

## 2017-01-29 ENCOUNTER — Ambulatory Visit (HOSPITAL_COMMUNITY)
Admission: RE | Admit: 2017-01-29 | Discharge: 2017-01-29 | Disposition: A | Discharge status: Home / Self Care | Payer: Federal, State, Local not specified - PPO | Source: Ambulatory Visit | Attending: Vascular Surgery | Admitting: Vascular Surgery

## 2017-01-29 ENCOUNTER — Ambulatory Visit (INDEPENDENT_AMBULATORY_CARE_PROVIDER_SITE_OTHER): Payer: Federal, State, Local not specified - PPO | Admitting: Vascular Surgery

## 2017-01-29 VITALS — BP 138/85 | HR 70 | Temp 97.1°F | Resp 18 | Ht 70.5 in | Wt 200.0 lb

## 2017-01-29 DIAGNOSIS — M7989 Other specified soft tissue disorders: Secondary | ICD-10-CM | POA: Insufficient documentation
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
## 2017-04-06 ENCOUNTER — Encounter: Payer: Self-pay | Admitting: Physician Assistant

## 2017-09-22 ENCOUNTER — Telehealth: Payer: Self-pay | Admitting: Family Medicine

## 2017-09-22 NOTE — Telephone Encounter (Unsigned)
Copied from CRM [id number]. Topic: Quick Communication - Rx Refill/Question >> Sep 22, 2017 11:25 AM Gaynelle AduPoole, Shalonda wrote: Medication:rivaroxaban (XARELTO) 20 MG TABS tablet  Has the patient contacted their pharmacy? Yes  Preferred Pharmacy (with phone number or street name):[facility][id number] Ginette Otto- [place], [place] - [address] [facility] [address] [phone] (Phone) [phone] (Fax)    Agent: Please be advised that RX refills may take up to 3 business days. We ask that you follow-up with your pharmacy.

## 2017-09-23 ENCOUNTER — Other Ambulatory Visit: Payer: Self-pay | Admitting: *Deleted

## 2017-09-23 DIAGNOSIS — Z7901 Long term (current) use of anticoagulants: Secondary | ICD-10-CM

## 2017-09-23 DIAGNOSIS — Z86711 Personal history of pulmonary embolism: Secondary | ICD-10-CM

## 2017-09-23 MED ORDER — RIVAROXABAN 20 MG PO TABS: ORAL_TABLET | ORAL | 0 refills | Status: DC

## 2017-09-23 NOTE — Telephone Encounter (Signed)
Spoke with patient he states he has one pill left has a physical schedule with Dr. Neva SeatGreene on 10-4 30 days was sent it for patient Patient advised

## 2017-10-09 ENCOUNTER — Other Ambulatory Visit: Payer: Self-pay

## 2017-10-09 ENCOUNTER — Encounter

## 2017-10-09 ENCOUNTER — Ambulatory Visit: Payer: Federal, State, Local not specified - PPO | Admitting: Family Medicine

## 2017-10-09 ENCOUNTER — Encounter: Payer: Self-pay | Admitting: Family Medicine

## 2017-10-09 VITALS — BP 135/79 | HR 60 | Temp 97.8°F | Resp 18 | Ht 69.84 in | Wt 197.0 lb

## 2017-10-09 DIAGNOSIS — I87009 Postthrombotic syndrome without complications of unspecified extremity: Secondary | ICD-10-CM | POA: Diagnosis not present

## 2017-10-09 DIAGNOSIS — Z7901 Long term (current) use of anticoagulants: Secondary | ICD-10-CM | POA: Diagnosis not present

## 2017-10-09 DIAGNOSIS — Z23 Encounter for immunization: Secondary | ICD-10-CM

## 2017-10-09 DIAGNOSIS — Z86718 Personal history of other venous thrombosis and embolism: Secondary | ICD-10-CM

## 2017-10-09 DIAGNOSIS — Z8719 Personal history of other diseases of the digestive system: Secondary | ICD-10-CM | POA: Diagnosis not present

## 2017-10-09 DIAGNOSIS — E785 Hyperlipidemia, unspecified: Secondary | ICD-10-CM

## 2017-10-09 MED ORDER — ATORVASTATIN CALCIUM 40 MG PO TABS: 40.0000 mg | ORAL_TABLET | Freq: Every day | ORAL | 1 refills | Status: DC

## 2017-10-09 MED ORDER — RIVAROXABAN 20 MG PO TABS: ORAL_TABLET | ORAL | 1 refills | Status: DC

## 2017-10-09 NOTE — Patient Instructions (Addendum)
See info on hemorrhoids. Make sure to drink plenty of water and fiber in diet.  If you have any return of blood in stool - return right away for exam and to discuss other possible causes.   Saline nasal spray as needed to lessen chance of nosebleeds. Dust mask in dusty conditions. If nose bleeds continue, return to discuss next step.   Wear compression stocking (low pressure ok initially at medical supply store) for left leg swelling. No change in Xarelto dose for now.   No change in meds today. I will check cholesterol testing.   Recheck for a physical in next 3 months.   Nosebleed, Adult A nosebleed is when blood comes out of the nose. Nosebleeds are common. Usually, they are not a sign of a serious condition. Nosebleeds can happen if a small blood vessel in your nose starts to bleed or if the lining of your nose (mucous membrane) cracks. They are commonly caused by:  Allergies.  Colds.  Picking your nose.  Blowing your nose too hard.  An injury from sticking an object into your nose or getting hit in the nose.  Dry or cold air.  Less common causes of nosebleeds include:  Toxic fumes.  Something abnormal in the nose or in the air-filled spaces in the bones of the face (sinuses).  Growths in the nose, such as polyps.  Medicines or conditions that cause blood to clot slowly.  Certain illnesses or procedures that irritate or dry out the nasal passages.  Follow these instructions at home: When you have a nosebleed:  Sit down and tilt your head slightly forward.  Use a clean towel or tissue to pinch your nostrils under the bony part of your nose. After 10 minutes, let go of your nose and see if bleeding starts again. Do not release pressure before that time. If there is still bleeding, repeat the pinching and holding for 10 minutes until the bleeding stops.  Do not place tissues or gauze in the nose to stop bleeding.  Avoid lying down and avoid tilting your head  backward. That may make blood collect in the throat and cause gagging or coughing.  Use a nasal spray decongestant to help with a nosebleed as told by your health care provider.  Do not use petroleum jelly or mineral oil in your nose. It can drip into your lungs. After a nosebleed:  Avoid blowing your nose or sniffing for a number of hours.  Avoid straining, lifting, or bending at the waist for several days. You may resume other normal activities as you are able.  Use saline spray or a humidifier as told by your health care provider.  Aspirinand blood thinners make bleeding more likely. If you are prescribed these medicines and you suffer from nosebleeds: ? Ask your health care provider if you should stop taking the medicines or if you should adjust the dose. ? Do not stop taking medicines that your health care provider has recommended unless told by your health care provider.  If your nosebleed was caused by dry mucous membranes, use over-the-counter saline nasal spray or gel. This will keep the mucous membranes moist and allow them to heal. If you must use a lubricant: ? Choose one that is water-soluble. ? Use only as much as you need and use it only as often as needed. ? Do not lie down until several hours after you use it. Contact a health care provider if:  You have a fever.  You  get nosebleeds often or more often than usual.  You bruise very easily.  You have a nosebleed from having something stuck in your nose.  You have bleeding in your mouth.  You vomit or cough up brown material.  You have a nosebleed after you start a new medicine. Get help right away if:  You have a nosebleed after a fall or a head injury.  Your nosebleed does not go away after 20 minutes.  You feel dizzy or weak.  You have unusual bleeding from other parts of your body.  You have unusual bruising on other parts of your body.  You become sweaty.  You vomit blood. This information is not  intended to replace advice given to you by your health care provider. Make sure you discuss any questions you have with your health care provider. Document Released: 10/02/2004 Document Revised: 08/23/2015 Document Reviewed: 07/10/2015 Elsevier Interactive Patient Education  2018 [employer].   Hemorrhoids Hemorrhoids are swollen veins in and around the rectum or anus. There are two types of hemorrhoids:  Internal hemorrhoids. These occur in the veins that are just inside the rectum. They may poke through to the outside and become irritated and painful.  External hemorrhoids. These occur in the veins that are outside of the anus and can be felt as a painful swelling or hard lump near the anus.  Most hemorrhoids do not cause serious problems, and they can be managed with home treatments such as diet and lifestyle changes. If home treatments do not help your symptoms, procedures can be done to shrink or remove the hemorrhoids. What are the causes? This condition is caused by increased pressure in the anal area. This pressure may result from various things, including:  Constipation.  Straining to have a bowel movement.  Diarrhea.  Pregnancy.  Obesity.  Sitting for long periods of time.  Heavy lifting or other activity that causes you to strain.  Anal sex.  What are the signs or symptoms? Symptoms of this condition include:  Pain.  Anal itching or irritation.  Rectal bleeding.  Leakage of stool (feces).  Anal swelling.  One or more lumps around the anus.  How is this diagnosed? This condition can often be diagnosed through a visual exam. Other exams or tests may also be done, such as:  Examination of the rectal area with a gloved hand (digital rectal exam).  Examination of the anal canal using a small tube (anoscope).  A blood test, if you have lost a significant amount of blood.  A test to look inside the colon (sigmoidoscopy or colonoscopy).  How is this  treated? This condition can usually be treated at home. However, various procedures may be done if dietary changes, lifestyle changes, and other home treatments do not help your symptoms. These procedures can help make the hemorrhoids smaller or remove them completely. Some of these procedures involve surgery, and others do not. Common procedures include:  Rubber band ligation. Rubber bands are placed at the base of the hemorrhoids to cut off the blood supply to them.  Sclerotherapy. Medicine is injected into the hemorrhoids to shrink them.  Infrared coagulation. A type of light energy is used to get rid of the hemorrhoids.  Hemorrhoidectomy surgery. The hemorrhoids are surgically removed, and the veins that supply them are tied off.  Stapled hemorrhoidopexy surgery. A circular stapling device is used to remove the hemorrhoids and use staples to cut off the blood supply to them.  Follow these instructions at  home: Eating and drinking  Eat foods that have a lot of fiber in them, such as whole grains, beans, nuts, fruits, and vegetables. Ask your health care provider about taking products that have added fiber (fiber supplements).  Drink enough fluid to keep your urine clear or pale yellow. Managing pain and swelling  Take warm sitz baths for 20 minutes, 3-4 times a day to ease pain and discomfort.  If directed, apply ice to the affected area. Using ice packs between sitz baths may be helpful. ? Put ice in a plastic bag. ? Place a towel between your skin and the bag. ? Leave the ice on for 20 minutes, 2-3 times a day. General instructions  Take over-the-counter and prescription medicines only as told by your health care provider.  Use medicated creams or suppositories as told.  Exercise regularly.  Go to the bathroom when you have the urge to have a bowel movement. Do not wait.  Avoid straining to have bowel movements.  Keep the anal area dry and clean. Use wet toilet paper or  moist towelettes after a bowel movement.  Do not sit on the toilet for long periods of time. This increases blood pooling and pain. Contact a health care provider if:  You have increasing pain and swelling that are not controlled by treatment or medicine.  You have uncontrolled bleeding.  You have difficulty having a bowel movement, or you are unable to have a bowel movement.  You have pain or inflammation outside the area of the hemorrhoids. This information is not intended to replace advice given to you by your health care provider. Make sure you discuss any questions you have with your health care provider. Document Released: 12/21/1999 Document Revised: 05/23/2015 Document Reviewed: 09/06/2014 Elsevier Interactive Patient Education  [employer].    If you have lab work done today you will be contacted with your lab results within the next 2 weeks.  If you have not heard from [place] then please contact us. The fastest way to get your results is to register for My Chart.   IF you received an x-ray today, you will receive an invoice from [facility]. Please contact [facility] at [phone] with questions or concerns regarding your invoice.   IF you received labwork today, you will receive an invoice from [facility]. Please contact [facility] at [phone] with questions or concerns regarding your invoice.   Our billing staff will not be able to assist you with questions regarding bills from these companies.  You will be contacted with the lab results as soon as they are available. The fastest way to get your results is to activate your My Chart account. Instructions are located on the last page of this paperwork. If you have not heard from [place] regarding the results in 2 weeks, please contact this office.

## 2017-10-09 NOTE — Progress Notes (Signed)
Subjective:  By signing my name below, I, Ryan Gutierrez, attest that this documentation has been prepared under the direction and in the presence of Ryan Flood, MD Electronically Signed: Charline Bills, ED Scribe 10/09/2017 at 2:25 PM.   Patient ID: Ryan Gutierrez, male    DOB: 10/08/1960, 57 y.o.   MRN: [id number]  Chief Complaint  Patient presents with  . Establish Care   HPI Ryan Gutierrez is a 57 y.o. male who presents to Primary Care at [facility] to establish care. New pt. Prev saw Trena Platt, PA-C. H/o 2 DVTs in 1996 then 2003 both on L leg with residual chronic swelling. On chronic xarelto for anticoagulation. Seen 1/24 by vascular specialist Dr. Darrick Penna. Suspected postphlebitic syndrome, recommended compression stockings to control swelling and prevent skin break down. Venous duplex with reflux in the deep system.  Pt is still taking xarelto qd. He has ran out of meds a few wks ago but is back on it. He reports intermittent R nose bleed once/month. 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 Pt also reports minimal bright red blood in his stool a few wks ago that has resolved which he suspects may have been attributed to hemorrhoids. Denies straining with BMs, hematuria, difficulty breathing, sob, cp. Colonoscopy 10/04/12, rpt in 10 yrs, normal. He has not purchased compression stockings for leg swelling yet. Pt is taking Lipitor daily; denies any new myalgias. Pt is fasting at this time.  Patient Active Problem List   Diagnosis Date Noted  . Mixed hyperlipidemia 05/26/2013  . Other pulmonary embolism and infarction 05/26/2013  . Long term (current) use of anticoagulants 05/26/2013  . Hematochezia 11/05/2011  . Recurrent deep vein thrombosis (HCC) 10/24/2011   Past Medical History:  Diagnosis Date  . Clotting disorder (HCC)   . DVT (deep venous thrombosis) (HCC)    post-knee surgery  . Hyperlipidemia   . Sickle cell trait [facility])    Past Surgical  History:  Procedure Laterality Date  . HERNIA REPAIR    . KNEE SURGERY  1990s   No Known Allergies Prior to Admission medications   Medication Sig Start Date End Date Taking? Authorizing Provider  atorvastatin (LIPITOR) 40 MG tablet Take 1 tablet (40 mg total) by mouth daily. 10/27/16  Yes English, Judeth Cornfield D, PA  rivaroxaban (XARELTO) 20 MG TABS tablet TAKE 1 TABLET BY MOUTH DAILY WITH SUPPER. 09/23/17  Yes Ryan Flood, MD   Social History   Socioeconomic History  . Marital status: Married    Spouse name: Not on file  . Number of children: 2  . Years of education: Not on file  . Highest education level: Not on file  Occupational History  . Not on file  Social Needs  . Financial resource strain: Not on file  . Food insecurity:    Worry: Not on file    Inability: Not on file  . Transportation needs:    Medical: Not on file    Non-medical: Not on file  Tobacco Use  . Smoking status: Never Smoker  . Smokeless tobacco: Never Used  Substance and Sexual Activity  . Alcohol use: Yes    Alcohol/week: 0.0 standard drinks    Comment: occ  . Drug use: No  . Sexual activity: Yes  Lifestyle  . Physical activity:    Days per week: Not on file    Minutes per session: Not on file  . Stress: Not on file  Relationships  . Social connections:  Talks on phone: Not on file    Gets together: Not on file    Attends religious service: Not on file    Active member of club or organization: Not on file    Attends meetings of clubs or organizations: Not on file    Relationship status: Not on file  . Intimate partner violence:    Fear of current or ex partner: Not on file    Emotionally abused: Not on file    Physically abused: Not on file    Forced sexual activity: Not on file  Other Topics Concern  . Not on file  Social History Narrative  . Not on file   Review of Systems  HENT: Positive for nosebleeds.   Respiratory: Negative for shortness of breath.   Cardiovascular:  Negative for chest pain.  Gastrointestinal: Positive for blood in stool (resolved).  Genitourinary: Negative for hematuria.  Musculoskeletal: Negative for myalgias.      Objective:   Physical Exam  Constitutional: He is oriented to person, place, and time. He appears well-developed and well-nourished. No distress.  HENT:  Head: Normocephalic and atraumatic.  Mouth/Throat: Oropharynx is clear and moist.  Mouth/Throat: No post oropharyngeal drainage or bleeding.  Eyes: Conjunctivae and EOM are normal.  Neck: Neck supple. No tracheal deviation present.  Cardiovascular: Normal rate.  Pulmonary/Chest: Effort normal. No respiratory distress.  Musculoskeletal: Normal range of motion. He exhibits edema.  1+ edema of LLE to proximal third tibia. No skin breakdown or ulceration.  Neurological: He is alert and oriented to person, place, and time.  Skin: Skin is warm and dry.  Psychiatric: He has a normal mood and affect. His behavior is normal.  Nursing note and vitals reviewed.  Vitals:   10/09/17 1401  BP: 135/79  Pulse: 60  Resp: 18  Temp: 97.8 F (36.6 C)  TempSrc: Oral  SpO2: 97%  Weight: 197 lb (89.4 kg)  Height: 5' 9.84" (1.774 m)      Assessment & Plan:    [name] is a 57 y.o. male History of hemorrhoids  - prevention discussed. Handout given. rtc precautions if recurrence of bleeding to verify cause. RTC/ER precautions if significant bleeding d/t chronic anticoagulant use.   Chronic anticoagulation Postphlebitic syndrome Long term (current) use of anticoagulants - Plan: rivaroxaban (XARELTO) 20 MG TABS tablet Hx of DVT - Plan: rivaroxaban (XARELTO) 20 MG TABS tablet  - tolerating xarelto. No change in dose.   - compression stockings discussed.   - dust mask to lessen chance of nosebleed, rtc if recurrent and ER precautions.   Hyperlipidemia, unspecified hyperlipidemia type - Plan: atorvastatin (LIPITOR) 40 MG tablet  - continue lipitor, follow up in 3 months  for physical   Meds ordered this encounter  Medications  . rivaroxaban (XARELTO) 20 MG TABS tablet    Sig: TAKE 1 TABLET BY MOUTH DAILY WITH SUPPER.    Dispense:  90 tablet    Refill:  1    Fill at patient request.  . atorvastatin (LIPITOR) 40 MG tablet    Sig: Take 1 tablet (40 mg total) by mouth daily.    Dispense:  90 tablet    Refill:  1   Patient Instructions   See info on hemorrhoids. Make sure to drink plenty of water and fiber in diet.  If you have any return of blood in stool - return right away for exam and to discuss other possible causes.   Saline nasal spray as needed to lessen chance of  nosebleeds. Dust mask in dusty conditions. If nose bleeds continue, return to discuss next step.   Wear compression stocking (low pressure ok initially at medical supply store) for left leg swelling. No change in Xarelto dose for now.   No change in meds today. I will check cholesterol testing.   Recheck for a physical in next 3 months.   Nosebleed, Adult A nosebleed is when blood comes out of the nose. Nosebleeds are common. Usually, they are not a sign of a serious condition. Nosebleeds can happen if a small blood vessel in your nose starts to bleed or if the lining of your nose (mucous membrane) cracks. They are commonly caused by:  Allergies.  Colds.  Picking your nose.  Blowing your nose too hard.  An injury from sticking an object into your nose or getting hit in the nose.  Dry or cold air.  Less common causes of nosebleeds include:  Toxic fumes.  Something abnormal in the nose or in the air-filled spaces in the bones of the face (sinuses).  Growths in the nose, such as polyps.  Medicines or conditions that cause blood to clot slowly.  Certain illnesses or procedures that irritate or dry out the nasal passages.  Follow these instructions at home: When you have a nosebleed:  Sit down and tilt your head slightly forward.  Use a clean towel or tissue to  pinch your nostrils under the bony part of your nose. After 10 minutes, let go of your nose and see if bleeding starts again. Do not release pressure before that time. If there is still bleeding, repeat the pinching and holding for 10 minutes until the bleeding stops.  Do not place tissues or gauze in the nose to stop bleeding.  Avoid lying down and avoid tilting your head backward. That may make blood collect in the throat and cause gagging or coughing.  Use a nasal spray decongestant to help with a nosebleed as told by your health care provider.  Do not use petroleum jelly or mineral oil in your nose. It can drip into your lungs. After a nosebleed:  Avoid blowing your nose or sniffing for a number of hours.  Avoid straining, lifting, or bending at the waist for several days. You may resume other normal activities as you are able.  Use saline spray or a humidifier as told by your health care provider.  Aspirinand blood thinners make bleeding more likely. If you are prescribed these medicines and you suffer from nosebleeds: ? Ask your health care provider if you should stop taking the medicines or if you should adjust the dose. ? Do not stop taking medicines that your health care provider has recommended unless told by your health care provider.  If your nosebleed was caused by dry mucous membranes, use over-the-counter saline nasal spray or gel. This will keep the mucous membranes moist and allow them to heal. If you must use a lubricant: ? Choose one that is water-soluble. ? Use only as much as you need and use it only as often as needed. ? Do not lie down until several hours after you use it. Contact a health care provider if:  You have a fever.  You get nosebleeds often or more often than usual.  You bruise very easily.  You have a nosebleed from having something stuck in your nose.  You have bleeding in your mouth.  You vomit or cough up brown material.  You have a  nosebleed after you  start a new medicine. Get help right away if:  You have a nosebleed after a fall or a head injury.  Your nosebleed does not go away after 20 minutes.  You feel dizzy or weak.  You have unusual bleeding from other parts of your body.  You have unusual bruising on other parts of your body.  You become sweaty.  You vomit blood. This information is not intended to replace advice given to you by your health care provider. Make sure you discuss any questions you have with your health care provider. Document Released: 10/02/2004 Document Revised: 08/23/2015 Document Reviewed: 07/10/2015 Elsevier Interactive Patient Education  2018 [employer].   Hemorrhoids Hemorrhoids are swollen veins in and around the rectum or anus. There are two types of hemorrhoids:  Internal hemorrhoids. These occur in the veins that are just inside the rectum. They may poke through to the outside and become irritated and painful.  External hemorrhoids. These occur in the veins that are outside of the anus and can be felt as a painful swelling or hard lump near the anus.  Most hemorrhoids do not cause serious problems, and they can be managed with home treatments such as diet and lifestyle changes. If home treatments do not help your symptoms, procedures can be done to shrink or remove the hemorrhoids. What are the causes? This condition is caused by increased pressure in the anal area. This pressure may result from various things, including:  Constipation.  Straining to have a bowel movement.  Diarrhea.  Pregnancy.  Obesity.  Sitting for long periods of time.  Heavy lifting or other activity that causes you to strain.  Anal sex.  What are the signs or symptoms? Symptoms of this condition include:  Pain.  Anal itching or irritation.  Rectal bleeding.  Leakage of stool (feces).  Anal swelling.  One or more lumps around the anus.  How is this diagnosed? This  condition can often be diagnosed through a visual exam. Other exams or tests may also be done, such as:  Examination of the rectal area with a gloved hand (digital rectal exam).  Examination of the anal canal using a small tube (anoscope).  A blood test, if you have lost a significant amount of blood.  A test to look inside the colon (sigmoidoscopy or colonoscopy).  How is this treated? This condition can usually be treated at home. However, various procedures may be done if dietary changes, lifestyle changes, and other home treatments do not help your symptoms. These procedures can help make the hemorrhoids smaller or remove them completely. Some of these procedures involve surgery, and others do not. Common procedures include:  Rubber band ligation. Rubber bands are placed at the base of the hemorrhoids to cut off the blood supply to them.  Sclerotherapy. Medicine is injected into the hemorrhoids to shrink them.  Infrared coagulation. A type of light energy is used to get rid of the hemorrhoids.  Hemorrhoidectomy surgery. The hemorrhoids are surgically removed, and the veins that supply them are tied off.  Stapled hemorrhoidopexy surgery. A circular stapling device is used to remove the hemorrhoids and use staples to cut off the blood supply to them.  Follow these instructions at home: Eating and drinking  Eat foods that have a lot of fiber in them, such as whole grains, beans, nuts, fruits, and vegetables. Ask your health care provider about taking products that have added fiber (fiber supplements).  Drink enough fluid to keep your urine clear or  pale yellow. Managing pain and swelling  Take warm sitz baths for 20 minutes, 3-4 times a day to ease pain and discomfort.  If directed, apply ice to the affected area. Using ice packs between sitz baths may be helpful. ? Put ice in a plastic bag. ? Place a towel between your skin and the bag. ? Leave the ice on for 20 minutes, 2-3  times a day. General instructions  Take over-the-counter and prescription medicines only as told by your health care provider.  Use medicated creams or suppositories as told.  Exercise regularly.  Go to the bathroom when you have the urge to have a bowel movement. Do not wait.  Avoid straining to have bowel movements.  Keep the anal area dry and clean. Use wet toilet paper or moist towelettes after a bowel movement.  Do not sit on the toilet for long periods of time. This increases blood pooling and pain. Contact a health care provider if:  You have increasing pain and swelling that are not controlled by treatment or medicine.  You have uncontrolled bleeding.  You have difficulty having a bowel movement, or you are unable to have a bowel movement.  You have pain or inflammation outside the area of the hemorrhoids. This information is not intended to replace advice given to you by your health care provider. Make sure you discuss any questions you have with your health care provider. Document Released: 12/21/1999 Document Revised: 05/23/2015 Document Reviewed: 09/06/2014 Elsevier Interactive Patient Education  [employer].    If you have lab work done today you will be contacted with your lab results within the next 2 weeks.  If you have not heard from [place] then please contact us. The fastest way to get your results is to register for My Chart.   IF you received an x-ray today, you will receive an invoice from [facility]. Please contact [facility] at [phone] with questions or concerns regarding your invoice.   IF you received labwork today, you will receive an invoice from [facility]. Please contact [facility] at [phone] with questions or concerns regarding your invoice.   Our billing staff will not be able to assist you with questions regarding bills from these companies.  You will be contacted with the lab results as soon as they are available.  The fastest way to get your results is to activate your My Chart account. Instructions are located on the last page of this paperwork. If you have not heard from [place] regarding the results in 2 weeks, please contact this office.      I personally performed the services described in this documentation, which was scribed in my presence. The recorded information has been reviewed and considered for accuracy and completeness, addended by me as needed, and agree with information above.  Signed,   Meredith Staggers, MD Primary Care at [facility].  10/11/17 10:00 PM

## 2017-10-11 ENCOUNTER — Encounter: Payer: Self-pay | Admitting: Family Medicine

## 2018-05-02 ENCOUNTER — Other Ambulatory Visit: Payer: Self-pay | Admitting: Family Medicine

## 2018-05-02 DIAGNOSIS — Z86718 Personal history of other venous thrombosis and embolism: Secondary | ICD-10-CM

## 2018-05-02 DIAGNOSIS — Z7901 Long term (current) use of anticoagulants: Secondary | ICD-10-CM

## 2018-06-08 ENCOUNTER — Other Ambulatory Visit: Payer: Self-pay | Admitting: Family Medicine

## 2018-06-08 DIAGNOSIS — Z86718 Personal history of other venous thrombosis and embolism: Secondary | ICD-10-CM

## 2018-06-08 DIAGNOSIS — Z7901 Long term (current) use of anticoagulants: Secondary | ICD-10-CM

## 2018-12-14 ENCOUNTER — Other Ambulatory Visit (HOSPITAL_COMMUNITY)
Admission: RE | Admit: 2018-12-14 | Discharge: 2018-12-14 | Disposition: A | Discharge status: Home / Self Care | Payer: Federal, State, Local not specified - PPO | Source: Ambulatory Visit | Attending: Registered Nurse | Admitting: Registered Nurse

## 2018-12-14 ENCOUNTER — Other Ambulatory Visit: Payer: Self-pay

## 2018-12-14 ENCOUNTER — Encounter: Payer: Self-pay | Admitting: Registered Nurse

## 2018-12-14 ENCOUNTER — Ambulatory Visit (INDEPENDENT_AMBULATORY_CARE_PROVIDER_SITE_OTHER): Payer: Federal, State, Local not specified - PPO | Admitting: Registered Nurse

## 2018-12-14 VITALS — BP 142/72 | HR 75 | Temp 98.6°F | Resp 12 | Ht 70.0 in | Wt 205.0 lb

## 2018-12-14 DIAGNOSIS — Z86718 Personal history of other venous thrombosis and embolism: Secondary | ICD-10-CM

## 2018-12-14 DIAGNOSIS — E785 Hyperlipidemia, unspecified: Secondary | ICD-10-CM | POA: Diagnosis not present

## 2018-12-14 DIAGNOSIS — Z113 Encounter for screening for infections with a predominantly sexual mode of transmission: Secondary | ICD-10-CM | POA: Insufficient documentation

## 2018-12-14 DIAGNOSIS — Z13228 Encounter for screening for other metabolic disorders: Secondary | ICD-10-CM

## 2018-12-14 DIAGNOSIS — Z7901 Long term (current) use of anticoagulants: Secondary | ICD-10-CM

## 2018-12-14 DIAGNOSIS — Z0000 Encounter for general adult medical examination without abnormal findings: Secondary | ICD-10-CM

## 2018-12-14 DIAGNOSIS — Z1329 Encounter for screening for other suspected endocrine disorder: Secondary | ICD-10-CM | POA: Diagnosis not present

## 2018-12-14 DIAGNOSIS — Z1159 Encounter for screening for other viral diseases: Secondary | ICD-10-CM

## 2018-12-14 DIAGNOSIS — Z130 Encounter for screening for diseases of the blood and blood-forming organs and certain disorders involving the immune mechanism: Secondary | ICD-10-CM | POA: Diagnosis not present

## 2018-12-14 DIAGNOSIS — Z0001 Encounter for general adult medical examination with abnormal findings: Secondary | ICD-10-CM | POA: Diagnosis not present

## 2018-12-14 DIAGNOSIS — Z23 Encounter for immunization: Secondary | ICD-10-CM | POA: Diagnosis not present

## 2018-12-14 MED ORDER — RIVAROXABAN 20 MG PO TABS: 20.0000 mg | ORAL_TABLET | Freq: Every day | ORAL | 0 refills | Status: DC

## 2018-12-14 NOTE — Patient Instructions (Addendum)
   If you have lab work done today you will be contacted with your lab results within the next 2 weeks.  If you have not heard from us then please contact us. The fastest way to get your results is to register for My Chart.   IF you received an x-ray today, you will receive an invoice from [facility]. Please contact  Radiology at [phone] with questions or concerns regarding your invoice.   IF you received labwork today, you will receive an invoice from [facility]. Please contact [facility] at [phone] with questions or concerns regarding your invoice.   Our billing staff will not be able to assist you with questions regarding bills from these companies.  You will be contacted with the lab results as soon as they are available. The fastest way to get your results is to activate your My Chart account. Instructions are located on the last page of this paperwork. If you have not heard from us regarding the results in 2 weeks, please contact this office.       Health Maintenance, Male Adopting a healthy lifestyle and getting preventive care are important in promoting health and wellness. Ask your health care provider about:  The right schedule for you to have regular tests and exams.  Things you can do on your own to prevent diseases and keep yourself healthy. What should I know about diet, weight, and exercise? Eat a healthy diet   Eat a diet that includes plenty of vegetables, fruits, low-fat dairy products, and lean protein.  Do not eat a lot of foods that are high in solid fats, added sugars, or sodium. Maintain a healthy weight Body mass index (BMI) is a measurement that can be used to identify possible weight problems. It estimates body fat based on height and weight. Your health care provider can help determine your BMI and help you achieve or maintain a healthy weight. Get regular exercise Get regular exercise. This is one of the most important things  you can do for your health. Most adults should:  Exercise for at least 150 minutes each week. The exercise should increase your heart rate and make you sweat (moderate-intensity exercise).  Do strengthening exercises at least twice a week. This is in addition to the moderate-intensity exercise.  Spend less time sitting. Even light physical activity can be beneficial. Watch cholesterol and blood lipids Have your blood tested for lipids and cholesterol at 56 years of age, then have this test every 5 years. You may need to have your cholesterol levels checked more often if:  Your lipid or cholesterol levels are high.  You are older than 56 years of age.  You are at high risk for heart disease. What should I know about cancer screening? Many types of cancers can be detected early and may often be prevented. Depending on your health history and family history, you may need to have cancer screening at various ages. This may include screening for:  Colorectal cancer.  Prostate cancer.  Skin cancer.  Lung cancer. What should I know about heart disease, diabetes, and high blood pressure? Blood pressure and heart disease  High blood pressure causes heart disease and increases the risk of stroke. This is more likely to develop in people who have high blood pressure readings, are of African descent, or are overweight.  Talk with your health care provider about your target blood pressure readings.  Have your blood pressure checked: ? Every 3-5 years if you are   18-39 years of age. ? Every year if you are 40 years old or older.  If you are between the ages of 65 and 75 and are a current or former smoker, ask your health care provider if you should have a one-time screening for abdominal aortic aneurysm (AAA). Diabetes Have regular diabetes screenings. This checks your fasting blood sugar level. Have the screening done:  Once every three years after age 45 if you are at a normal weight and  have a low risk for diabetes.  More often and at a younger age if you are overweight or have a high risk for diabetes. What should I know about preventing infection? Hepatitis B If you have a higher risk for hepatitis B, you should be screened for this virus. Talk with your health care provider to find out if you are at risk for hepatitis B infection. Hepatitis C Blood testing is recommended for:  Everyone born from 1945 through 1965.  Anyone with known risk factors for hepatitis C. Sexually transmitted infections (STIs)  You should be screened each year for STIs, including gonorrhea and chlamydia, if: ? You are sexually active and are younger than 56 years of age. ? You are older than 56 years of age and your health care provider tells you that you are at risk for this type of infection. ? Your sexual activity has changed since you were last screened, and you are at increased risk for chlamydia or gonorrhea. Ask your health care provider if you are at risk.  Ask your health care provider about whether you are at high risk for HIV. Your health care provider may recommend a prescription medicine to help prevent HIV infection. If you choose to take medicine to prevent HIV, you should first get tested for HIV. You should then be tested every 3 months for as long as you are taking the medicine. Follow these instructions at home: Lifestyle  Do not use any products that contain nicotine or tobacco, such as cigarettes, e-cigarettes, and chewing tobacco. If you need help quitting, ask your health care provider.  Do not use street drugs.  Do not share needles.  Ask your health care provider for help if you need support or information about quitting drugs. Alcohol use  Do not drink alcohol if your health care provider tells you not to drink.  If you drink alcohol: ? Limit how much you have to 0-2 drinks a day. ? Be aware of how much alcohol is in your drink. In the [place] one drink equals  one 12 oz bottle of beer (355 mL), one 5 oz glass of wine (148 mL), or one 1 oz glass of hard liquor (44 mL). General instructions  Schedule regular health, dental, and eye exams.  Stay current with your vaccines.  Tell your health care provider if: ? You often feel depressed. ? You have ever been abused or do not feel safe at home. Summary  Adopting a healthy lifestyle and getting preventive care are important in promoting health and wellness.  Follow your health care provider's instructions about healthy diet, exercising, and getting tested or screened for diseases.  Follow your health care provider's instructions on monitoring your cholesterol and blood pressure. This information is not intended to replace advice given to you by your health care provider. Make sure you discuss any questions you have with your health care provider. Document Released: 06/21/2007 Document Revised: 12/16/2017 Document Reviewed: 12/16/2017 Elsevier Patient Education  2020 [employer]       Why follow it? Research shows. . Those who follow the Mediterranean diet have a reduced risk of heart disease  . The diet is associated with a reduced incidence of Parkinson's and Alzheimer's diseases . People following the diet may have longer life expectancies and lower rates of chronic diseases  . The Dietary Guidelines for Americans recommends the Mediterranean diet as an eating plan to promote health and prevent disease  What Is the Mediterranean Diet?  . Healthy eating plan based on typical foods and recipes of Mediterranean-style cooking . The diet is primarily a plant based diet; these foods should make up a majority of meals   Starches - Plant based foods should make up a majority of meals - They are an important sources of vitamins, minerals, energy, antioxidants, and fiber - Choose whole grains, foods high in fiber and minimally processed items  - Typical grain sources include wheat, oats, barley, corn,  brown rice, bulgar, farro, millet, polenta, couscous  - Various types of beans include chickpeas, lentils, fava beans, black beans, white beans   Fruits  Veggies - Large quantities of antioxidant rich fruits & veggies; 6 or more servings  - Vegetables can be eaten raw or lightly drizzled with oil and cooked  - Vegetables common to the traditional Mediterranean Diet include: artichokes, arugula, beets, broccoli, brussel sprouts, cabbage, carrots, celery, collard greens, cucumbers, eggplant, kale, leeks, lemons, lettuce, mushrooms, okra, onions, peas, peppers, potatoes, pumpkin, radishes, rutabaga, shallots, spinach, sweet potatoes, turnips, zucchini - Fruits common to the Mediterranean Diet include: apples, apricots, avocados, cherries, clementines, dates, figs, grapefruits, grapes, melons, nectarines, oranges, peaches, pears, pomegranates, strawberries, tangerines  Fats - Replace butter and margarine with healthy oils, such as olive oil, canola oil, and tahini  - Limit nuts to no more than a handful a day  - Nuts include walnuts, almonds, pecans, pistachios, pine nuts  - Limit or avoid candied, honey roasted or heavily salted nuts - Olives are central to the Mediterranean diet - can be eaten whole or used in a variety of dishes   Meats Protein - Limiting red meat: no more than a few times a month - When eating red meat: choose lean cuts and keep the portion to the size of deck of cards - Eggs: approx. 0 to 4 times a week  - Fish and lean poultry: at least 2 a week  - Healthy protein sources include, chicken, turkey, lean beef, lamb - Increase intake of seafood such as tuna, salmon, trout, mackerel, shrimp, scallops - Avoid or limit high fat processed meats such as sausage and bacon  Dairy - Include moderate amounts of low fat dairy products  - Focus on healthy dairy such as fat free yogurt, skim milk, low or reduced fat cheese - Limit dairy products higher in fat such as whole or 2% milk, cheese,  ice cream  Alcohol - Moderate amounts of red wine is ok  - No more than 5 oz daily for women (all ages) and men older than age 65  - No more than 10 oz of wine daily for men younger than 65  Other - Limit sweets and other desserts  - Use herbs and spices instead of salt to flavor foods  - Herbs and spices common to the traditional Mediterranean Diet include: basil, bay leaves, chives, cloves, cumin, fennel, garlic, lavender, marjoram, mint, oregano, parsley, pepper, rosemary, sage, savory, sumac, tarragon, thyme   It's not just a diet, it's a lifestyle:  . The Mediterranean diet   includes lifestyle factors typical of those in the region  . Foods, drinks and meals are best eaten with others and savored . Daily physical activity is important for overall good health . This could be strenuous exercise like running and aerobics . This could also be more leisurely activities such as walking, housework, yard-work, or taking the stairs . Moderation is the key; a balanced and healthy diet accommodates most foods and drinks . Consider portion sizes and frequency of consumption of certain foods   Meal Ideas & Options:  . Breakfast:  o Whole wheat toast or whole wheat English muffins with peanut butter & hard boiled egg o Steel cut oats topped with apples & cinnamon and skim milk  o Fresh fruit: banana, strawberries, melon, berries, peaches  o Smoothies: strawberries, bananas, greek yogurt, peanut butter o Low fat greek yogurt with blueberries and granola  o Egg white omelet with spinach and mushrooms o Breakfast couscous: whole wheat couscous, apricots, skim milk, cranberries  . Sandwiches:  o Hummus and grilled vegetables (peppers, zucchini, squash) on whole wheat bread   o Grilled chicken on whole wheat pita with lettuce, tomatoes, cucumbers or tzatziki  o Tuna salad on whole wheat bread: tuna salad made with greek yogurt, olives, red peppers, capers, green onions o Garlic rosemary lamb pita:  lamb sauted with garlic, rosemary, salt & pepper; add lettuce, cucumber, greek yogurt to pita - flavor with lemon juice and black pepper  . Seafood:  o Mediterranean grilled salmon, seasoned with garlic, basil, parsley, lemon juice and black pepper o Shrimp, lemon, and spinach whole-grain pasta salad made with low fat greek yogurt  o Seared scallops with lemon orzo  o Seared tuna steaks seasoned salt, pepper, coriander topped with tomato mixture of olives, tomatoes, olive oil, minced garlic, parsley, green onions and cappers  . Meats:  o Herbed greek chicken salad with kalamata olives, cucumber, feta  o Red bell peppers stuffed with spinach, bulgur, lean ground beef (or lentils) & topped with feta   o Kebabs: skewers of chicken, tomatoes, onions, zucchini, squash  o Turkey burgers: made with red onions, mint, dill, lemon juice, feta cheese topped with roasted red peppers . Vegetarian o Cucumber salad: cucumbers, artichoke hearts, celery, red onion, feta cheese, tossed in olive oil & lemon juice  o Hummus and whole grain pita points with a greek salad (lettuce, tomato, feta, olives, cucumbers, red onion) o Lentil soup with celery, carrots made with vegetable broth, garlic, salt and pepper  o Tabouli salad: parsley, bulgur, mint, scallions, cucumbers, tomato, radishes, lemon juice, olive oil, salt and pepper.       Fat and Cholesterol Restricted Eating Plan Eating a diet that limits fat and cholesterol may help lower your risk for heart disease and other conditions. Your body needs fat and cholesterol for basic functions, but eating too much of these things can be harmful to your health. Your health care provider may order lab tests to check your blood fat (lipid) and cholesterol levels. This helps your health care provider understand your risk for certain conditions and whether you need to make diet changes. Work with your health care provider or dietitian to make an eating plan that is right for  you. Your plan includes:  Limit your fat intake to ______% or less of your total calories a day.  Limit your saturated fat intake to ______% or less of your total calories a day.  Limit the amount of cholesterol in your diet to less   than _________mg a day.  Eat ___________ g of fiber a day. What are tips for following this plan? General guidelines   If you are overweight, work with your health care provider to lose weight safely. Losing just 5-10% of your body weight can improve your overall health and help prevent diseases such as diabetes and heart disease.  Avoid: ? Foods with added sugar. ? Fried foods. ? Foods that contain partially hydrogenated oils, including stick margarine, some tub margarines, cookies, crackers, and other baked goods.  Limit alcohol intake to no more than 1 drink a day for nonpregnant women and 2 drinks a day for men. One drink equals 12 oz of beer, 5 oz of wine, or 1 oz of hard liquor. Reading food labels  Check food labels for: ? Trans fats, partially hydrogenated oils, or high amounts of saturated fat. Avoid foods that contain saturated fat and trans fat. ? The amount of cholesterol in each serving. Try to eat no more than 200 mg of cholesterol each day. ? The amount of fiber in each serving. Try to eat at least 20-30 g of fiber each day.  Choose foods with healthy fats, such as: ? Monounsaturated and polyunsaturated fats. These include olive and canola oil, flaxseeds, walnuts, almonds, and seeds. ? Omega-3 fats. These are found in foods such as salmon, mackerel, sardines, tuna, flaxseed oil, and ground flaxseeds.  Choose grain products that have whole grains. Look for the word "whole" as the first word in the ingredient list. Cooking  Cook foods using methods other than frying. Baking, boiling, grilling, and broiling are some healthy options.  Eat more home-cooked food and less restaurant, buffet, and fast food.  Avoid cooking using saturated  fats. ? Animal sources of saturated fats include meats, butter, and cream. ? Plant sources of saturated fats include palm oil, palm kernel oil, and coconut oil. Meal planning   At meals, imagine dividing your plate into fourths: ? Fill one-half of your plate with vegetables and green salads. ? Fill one-fourth of your plate with whole grains. ? Fill one-fourth of your plate with lean protein foods.  Eat fish that is high in omega-3 fats at least two times a week.  Eat more foods that contain fiber, such as whole grains, beans, apples, broccoli, carrots, peas, and barley. These foods help promote healthy cholesterol levels in the blood. Recommended foods Grains  Whole grains, such as whole wheat or whole grain breads, crackers, cereals, and pasta. Unsweetened oatmeal, bulgur, barley, quinoa, or brown rice. Corn or whole wheat flour tortillas. Vegetables  Fresh or frozen vegetables (raw, steamed, roasted, or grilled). Green salads. Fruits  All fresh, canned (in natural juice), or frozen fruits. Meats and other protein foods  Ground beef (85% or leaner), grass-fed beef, or beef trimmed of fat. Skinless chicken or turkey. Ground chicken or turkey. Pork trimmed of fat. All fish and seafood. Egg whites. Dried beans, peas, or lentils. Unsalted nuts or seeds. Unsalted canned beans. Natural nut butters without added sugar and oil. Dairy  Low-fat or nonfat dairy products, such as skim or 1% milk, 2% or reduced-fat cheeses, low-fat and fat-free ricotta or cottage cheese, or plain low-fat and nonfat yogurt. Fats and oils  Tub margarine without trans fats. Light or reduced-fat mayonnaise and salad dressings. Avocado. Olive, canola, sesame, or safflower oils. The items listed above may not be a complete list of recommended foods or beverages. Contact your dietitian for more options. Foods to avoid Grains  White bread.   White pasta. White rice. Cornbread. Bagels, pastries, and croissants.  Crackers and snack foods that contain trans fat and hydrogenated oils. Vegetables  Vegetables cooked in cheese, cream, or butter sauce. Fried vegetables. Fruits  Canned fruit in heavy syrup. Fruit in cream or butter sauce. Fried fruit. Meats and other protein foods  Fatty cuts of meat. Ribs, chicken wings, bacon, sausage, bologna, salami, chitterlings, fatback, hot dogs, bratwurst, and packaged lunch meats. Liver and organ meats. Whole eggs and egg yolks. Chicken and turkey with skin. Fried meat. Dairy  Whole or 2% milk, cream, half-and-half, and cream cheese. Whole milk cheeses. Whole-fat or sweetened yogurt. Full-fat cheeses. Nondairy creamers and whipped toppings. Processed cheese, cheese spreads, and cheese curds. Beverages  Alcohol. Sugar-sweetened drinks such as sodas, lemonade, and fruit drinks. Fats and oils  Butter, stick margarine, lard, shortening, ghee, or bacon fat. Coconut, palm kernel, and palm oils. Sweets and desserts  Corn syrup, sugars, honey, and molasses. Candy. Jam and jelly. Syrup. Sweetened cereals. Cookies, pies, cakes, donuts, muffins, and ice cream. The items listed above may not be a complete list of foods and beverages to avoid. Contact your dietitian for more information. Summary  Your body needs fat and cholesterol for basic functions. However, eating too much of these things can be harmful to your health.  Work with your health care provider and dietitian to follow a diet low in fat and cholesterol. Doing this may help lower your risk for heart disease and other conditions.  Choose healthy fats, such as monounsaturated and polyunsaturated fats, and foods high in omega-3 fatty acids.  Eat fiber-rich foods, such as whole grains, beans, peas, fruits, and vegetables.  Limit or avoid alcohol, fried foods, and foods high in saturated fats, partially hydrogenated oils, and sugar. This information is not intended to replace advice given to you by your health  care provider. Make sure you discuss any questions you have with your health care provider. Document Released: 12/23/2004 Document Revised: 12/05/2016 Document Reviewed: 09/09/2016 Elsevier Patient Education  2020 [employer] Inc.  American Heart Association (AHA) Exercise Recommendation  Being physically active is important to prevent heart disease and stroke, the nation's No. 1and No. 5killers. To improve overall cardiovascular health, we suggest at least 150 minutes per week of moderate exercise or 75 minutes per week of vigorous exercise (or a combination of moderate and vigorous activity). Thirty minutes a day, five times a week is an easy goal to remember. You will also experience benefits even if you divide your time into two or three segments of 10 to 15 minutes per day.  For people who would benefit from lowering their blood pressure or cholesterol, we recommend 40 minutes of aerobic exercise of moderate to vigorous intensity three to four times a week to lower the risk for heart attack and stroke.  Physical activity is anything that makes you move your body and burn calories.  This includes things like climbing stairs or playing sports. Aerobic exercises benefit your heart, and include walking, jogging, swimming or biking. Strength and stretching exercises are best for overall stamina and flexibility.  The simplest, positive change you can make to effectively improve your heart health is to start walking. It's enjoyable, free, easy, social and great exercise. A walking program is flexible and boasts high success rates because people can stick with it. It's easy for walking to become a regular and satisfying part of life.   For Overall Cardiovascular Health:  At least 30 minutes of moderate-intensity aerobic activity   at least 5 days per week for a total of 150  OR   At least 25 minutes of vigorous aerobic activity at least 3 days per week for a total of 75 minutes; or a combination of  moderate- and vigorous-intensity aerobic activity  AND   Moderate- to high-intensity muscle-strengthening activity at least 2 days per week for additional health benefits.  For Lowering Blood Pressure and Cholesterol  An average 40 minutes of moderate- to vigorous-intensity aerobic activity 3 or 4 times per week  What if I can't make it to the time goal? Something is always better than nothing! And everyone has to start somewhere. Even if you've been sedentary for years, today is the day you can begin to make healthy changes in your life. If you don't think you'll make it for 30 or 40 minutes, set a reachable goal for today. You can work up toward your overall goal by increasing your time as you get stronger. Don't let all-or-nothing thinking rob you of doing what you can every day.  Source:http://www.heart.org    

## 2018-12-15 ENCOUNTER — Telehealth: Payer: Self-pay | Admitting: Registered Nurse

## 2018-12-15 ENCOUNTER — Other Ambulatory Visit: Payer: Self-pay | Admitting: Registered Nurse

## 2018-12-15 DIAGNOSIS — Z7901 Long term (current) use of anticoagulants: Secondary | ICD-10-CM

## 2018-12-15 DIAGNOSIS — E785 Hyperlipidemia, unspecified: Secondary | ICD-10-CM

## 2018-12-15 DIAGNOSIS — Z86718 Personal history of other venous thrombosis and embolism: Secondary | ICD-10-CM

## 2018-12-15 LAB — CMP14+EGFR
ALT: 42 IU/L (ref 0–44)
AST: 28 IU/L (ref 0–40)
Albumin/Globulin Ratio: 1.2 (ref 1.2–2.2)
Albumin: 4.5 g/dL (ref 3.8–4.9)
Alkaline Phosphatase: 122 IU/L — ABNORMAL HIGH (ref 39–117)
BUN/Creatinine Ratio: 14 (ref 9–20)
BUN: 15 mg/dL (ref 6–24)
Bilirubin Total: 0.4 mg/dL (ref 0.0–1.2)
CO2: 25 mmol/L (ref 20–29)
Calcium: 10.1 mg/dL (ref 8.7–10.2)
Chloride: 101 mmol/L (ref 96–106)
Creatinine, Ser: 1.11 mg/dL (ref 0.76–1.27)
GFR calc Af Amer: 85 mL/min/{1.73_m2} (ref >59)
GFR calc non Af Amer: 74 mL/min/{1.73_m2} (ref >59)
Globulin, Total: 3.9 g/dL (ref 1.5–4.5)
Glucose: 107 mg/dL — ABNORMAL HIGH (ref 65–99)
Potassium: 4.6 mmol/L (ref 3.5–5.2)
Sodium: 143 mmol/L (ref 134–144)
Total Protein: 8.4 g/dL (ref 6.0–8.5)

## 2018-12-15 LAB — LIPID PANEL
Chol/HDL Ratio: 5.4 ratio — ABNORMAL HIGH (ref 0.0–5.0)
Cholesterol, Total: 260 mg/dL — ABNORMAL HIGH (ref 100–199)
HDL: 48 mg/dL (ref >39)
LDL Chol Calc (NIH): 193 mg/dL — ABNORMAL HIGH (ref 0–99)
Triglycerides: 105 mg/dL (ref 0–149)
VLDL Cholesterol Cal: 19 mg/dL (ref 5–40)

## 2018-12-15 LAB — HIV ANTIBODY (ROUTINE TESTING W REFLEX): HIV Screen 4th Generation wRfx: NONREACTIVE

## 2018-12-15 LAB — CBC
Hematocrit: 46.4 % (ref 37.5–51.0)
Hemoglobin: 15.8 g/dL (ref 13.0–17.7)
MCH: 28.3 pg (ref 26.6–33.0)
MCHC: 34.1 g/dL (ref 31.5–35.7)
MCV: 83 fL (ref 79–97)
Platelets: 209 10*3/uL (ref 150–450)
RBC: 5.58 x10E6/uL (ref 4.14–5.80)
RDW: 13.9 % (ref 11.6–15.4)
WBC: 5.8 10*3/uL (ref 3.4–10.8)

## 2018-12-15 LAB — URINE CYTOLOGY ANCILLARY ONLY
Chlamydia: NEGATIVE
Comment: NEGATIVE
Comment: NEGATIVE
Comment: NORMAL
Neisseria Gonorrhea: NEGATIVE
Trichomonas: NEGATIVE

## 2018-12-15 LAB — HEMOGLOBIN A1C
Est. average glucose Bld gHb Est-mCnc: 123 mg/dL
Hgb A1c MFr Bld: 5.9 % — ABNORMAL HIGH (ref 4.8–5.6)

## 2018-12-15 LAB — TSH: TSH: 2.32 u[IU]/mL (ref 0.450–4.500)

## 2018-12-15 LAB — HEPATITIS C ANTIBODY: Hep C Virus Ab: <0.1 s/co ratio (ref 0.0–0.9)

## 2018-12-15 LAB — RPR: RPR Ser Ql: NONREACTIVE

## 2018-12-15 MED ORDER — ATORVASTATIN CALCIUM 40 MG PO TABS: 40.0000 mg | ORAL_TABLET | Freq: Every day | ORAL | 3 refills | Status: DC

## 2018-12-15 MED ORDER — RIVAROXABAN 20 MG PO TABS: 20.0000 mg | ORAL_TABLET | Freq: Every day | ORAL | 3 refills | Status: DC

## 2018-12-15 NOTE — Telephone Encounter (Signed)
Pt would like a cb concerning his lab results from [facility] 12/85/20. Please advise at [phone]

## 2018-12-16 NOTE — Progress Notes (Signed)
Established Patient Office Visit  Subjective:  Patient ID: Ryan Gutierrez, male    DOB: May 31, 1960  Age: 58 y.o. MRN: [id number]  CC:  Chief Complaint  Patient presents with  . Annual Exam    Pt stated--requesting for STD check. Denied fever or any symptoms.    HPI Ryan Gutierrez presents for CPE  Requesting STI check - no symptoms, no known exposure.  Otherwise feeling well. Has been taking xarelto for history of DVTs. No issues. Had been taking atorvastatin 45m PO qd, has run out for a number of months.  Past Medical History:  Diagnosis Date  . [place]   . [place]    post-k[place]  . Hyperlipidemia   . Sickle cell trait ([facility]     Past Surgical History:  Procedure Laterality Date  . HERNIA REPAIR    . KNEE SURGERY  1990s    Family History  Problem Relation Age of Onset  . Heart disease Mother   . Heart attack Mother   . Diabetes Sister   . Hypertension Sister   . Sickle cell anemia Son     Social History   Socioeconomic History  . Marital status: Married    Spouse name: Not on file  . Number of children: 2  . Years of education: Not on file  . Highest education level: Not on file  Occupational History  . Not on file  Tobacco Use  . Smoking status: Never Smoker  . Smokeless tobacco: Never Used  Substance and Sexual Activity  . Alcohol use: Yes    Alcohol/week: 0.0 standard drinks    Comment: occ  . Drug use: No  . Sexual activity: Yes  Other Topics Concern  . Not on file  Social History Narrative  . Not on file   Social Determinants of Health   Financial Resource Strain: Low Risk   . Difficulty of Paying Living Expenses: Not hard at all  Food Insecurity: No Food Insecurity  . Worried About RCharity fundraiserin the Last Year: Never true  . Ran Out of Food in the Last Year: Never true  Transportation Needs: No Transportation Needs  . Lack of Transportation (Medical): No  . Lack of  Transportation (Non-Medical): No  Physical Activity: Insufficiently Active  . Days of Exercise per Week: 3 days  . Minutes of Exercise per Session: 30 min  Stress: No Stress Concern Present  . Feeling of Stress : Only a little  Social Connections: Unknown  . Frequency of Communication with Friends and Family: Three times a week  . Frequency of Social Gatherings with Friends and Family: Twice a week  . Attends Religious Services: Patient refused  . Active Member of Clubs or Organizations: Patient refused  . Attends CArchivistMeetings: Patient refused  . Marital Status: Patient refused  Intimate Partner Violence: Not At Risk  . Fear of Current or Ex-Partner: No  . Emotionally Abused: No  . Physically Abused: No  . Sexually Abused: No    Outpatient Medications Prior to Visit  Medication Sig Dispense Refill  . atorvastatin (LIPITOR) 40 MG tablet Take 1 tablet (40 mg total) by mouth daily. (Patient not taking: Reported on 12/14/2018) 90 tablet 1  . rivaroxaban (XARELTO) 20 MG TABS tablet Take 1 tablet (20 mg total) by mouth daily with supper for 30 days. 30 tablet 0   No facility-administered medications prior to visit.    No Known Allergies  ROS  Review of Systems  Constitutional: Negative.   HENT: Negative.   Eyes: Negative.   Respiratory: Negative.   Cardiovascular: Negative.   Gastrointestinal: Negative.   Endocrine: Negative.   Genitourinary: Negative.   Musculoskeletal: Negative.   Skin: Negative.   Allergic/Immunologic: Negative.   Neurological: Negative.   Hematological: Negative.   Psychiatric/Behavioral: Negative.   All other systems reviewed and are negative.     Objective:    Physical Exam  Constitutional: He is oriented to person, place, and time. He appears well-developed and well-nourished. No distress.  HENT:  Head: Normocephalic and atraumatic.  Right Ear: External ear normal.  Left Ear: External ear normal.  Nose: Nose normal.   Mouth/Throat: Oropharynx is clear and moist. No oropharyngeal exudate.  Eyes: Pupils are equal, round, and reactive to light. Conjunctivae and EOM are normal. Right eye exhibits no discharge. Left eye exhibits no discharge. No scleral icterus.  Neck: No tracheal deviation present. No thyromegaly present.  Cardiovascular: Normal rate, regular rhythm, normal heart sounds and intact distal pulses. Exam reveals no gallop and no friction rub.  No murmur heard. Pulmonary/Chest: Effort normal and breath sounds normal. No respiratory distress. He has no wheezes. He has no rales. He exhibits no tenderness.  Abdominal: Soft. Bowel sounds are normal. He exhibits no distension and no mass. There is no abdominal tenderness. There is no rebound and no guarding.  Musculoskeletal:        General: No tenderness, deformity or edema. Normal range of motion.     Cervical back: Normal range of motion and neck supple.  Lymphadenopathy:    He has no cervical adenopathy.  Neurological: He is alert and oriented to person, place, and time. No cranial nerve deficit. He exhibits normal muscle tone. Coordination normal.  Skin: Skin is warm and dry. No rash noted. He is not diaphoretic. No erythema. No pallor.  Psychiatric: He has a normal mood and affect. His behavior is normal. Judgment and thought content normal.  Nursing note and vitals reviewed.   BP (!) 142/72   Pulse 75   Temp 98.6 F (37 C)   Resp 12   Ht _0  (1.778 m)   Wt 205 lb (93 kg)   SpO2 96%   BMI 29.41 kg/m  Wt Readings from Last 3 Encounters:  12/14/18 205 lb (93 kg)  10/09/17 197 lb (89.4 kg)  01/29/17 200 lb (90.7 kg)     There are no preventive care reminders to display for this patient.  There are no preventive care reminders to display for this patient.  Lab Results  Component Value Date   TSH 2.320 12/14/2018   Lab Results  Component Value Date   WBC 5.8 12/14/2018   HGB 15.8 12/14/2018   HCT 46.4 12/14/2018   MCV 83  12/14/2018   PLT 209 12/14/2018   Lab Results  Component Value Date   NA 143 12/14/2018   K 4.6 12/14/2018   CO2 25 12/14/2018   GLUCOSE 107 (H) 12/14/2018   BUN 15 12/14/2018   CREATININE 1.11 12/14/2018   BILITOT 0.4 12/14/2018   ALKPHOS 122 (H) 12/14/2018   AST 28 12/14/2018   ALT 42 12/14/2018   PROT 8.4 12/14/2018   ALBUMIN 4.5 12/14/2018   CALCIUM 10.1 12/14/2018   Lab Results  Component Value Date   CHOL 260 (H) 12/14/2018   Lab Results  Component Value Date   HDL 48 12/14/2018   Lab Results  Component Value Date   LDLCALC 193 (  H) 12/14/2018   Lab Results  Component Value Date   TRIG 105 12/14/2018   Lab Results  Component Value Date   CHOLHDL 5.4 (H) 12/14/2018   Lab Results  Component Value Date   HGBA1C 5.9 (H) 12/14/2018      Assessment & Plan:   Problem List Items Addressed This Visit      Other   Long term (current) use of anticoagulants    Other Visit Diagnoses    Screening for endocrine, metabolic and immunity disorder    -  Primary   Relevant Orders   TSH (Completed)   Hemoglobin A1c (Completed)   CBC (Completed)   CMP14+EGFR (Completed)   Hyperlipidemia, unspecified hyperlipidemia type       Relevant Orders   Lipid panel (Completed)   History of DVT (deep vein thrombosis)       Screen for STD (sexually transmitted disease)       Relevant Orders   HIV antibody (with reflex) (Completed)   RPR (Completed)   Urine cytology ancillary only (Completed)   Encounter for hepatitis C screening test for low risk patient       Relevant Orders   Hepatitis C Antibody (Completed)   Need for diphtheria-tetanus-pertussis (Tdap) vaccine       Relevant Orders   Tdap vaccine greater than or equal to 7yo IM (Completed)   Routine general medical examination at a health care facility          Meds ordered this encounter  Medications  . DISCONTD: rivaroxaban (XARELTO) 20 MG TABS tablet    Sig: Take 1 tablet (20 mg total) by mouth daily with  supper.    Dispense:  30 tablet    Refill:  0    Follow-up: Return in 1 year (on 12/14/2019).   PLAN  Refill xarelto  Labs drawn - will follow up as warranted. If lipids elevated, will restart atorvastatin  No abnormal findings on CPE  Patient encouraged to call clinic with any questions, comments, or concerns.   Maximiano Coss, NP

## 2019-01-10 ENCOUNTER — Other Ambulatory Visit: Payer: Self-pay | Admitting: Registered Nurse

## 2019-01-10 DIAGNOSIS — Z86718 Personal history of other venous thrombosis and embolism: Secondary | ICD-10-CM

## 2019-01-10 DIAGNOSIS — Z7901 Long term (current) use of anticoagulants: Secondary | ICD-10-CM

## 2019-03-25 ENCOUNTER — Ambulatory Visit: Payer: Federal, State, Local not specified - PPO | Admitting: Registered Nurse

## 2019-03-25 ENCOUNTER — Encounter: Payer: Self-pay | Admitting: Registered Nurse

## 2019-03-25 ENCOUNTER — Other Ambulatory Visit: Payer: Self-pay

## 2019-03-25 VITALS — BP 130/75 | HR 72 | Temp 98.0°F | Ht 70.0 in | Wt 216.5 lb

## 2019-03-25 DIAGNOSIS — J302 Other seasonal allergic rhinitis: Secondary | ICD-10-CM

## 2019-03-25 MED ORDER — CETIRIZINE HCL 10 MG PO TABS: 10.0000 mg | ORAL_TABLET | Freq: Every day | ORAL | 11 refills | Status: DC

## 2019-03-25 MED ORDER — FLUTICASONE PROPIONATE 50 MCG/ACT NA SUSP: 2.0000 | Freq: Every day | NASAL | 6 refills | Status: DC

## 2019-03-25 MED ORDER — AZELASTINE HCL 0.1 % NA SOLN: 1.0000 | Freq: Two times a day (BID) | NASAL | 12 refills | Status: DC

## 2019-03-25 MED ORDER — MONTELUKAST SODIUM 10 MG PO TABS: 10.0000 mg | ORAL_TABLET | Freq: Every day | ORAL | 3 refills | Status: DC

## 2019-03-25 NOTE — Patient Instructions (Addendum)
Mr Ryan Gutierrez to see you again today. With your allergy symptoms, I want to add these into your treatment:  Cetirizine (Zyrtec): please take 1 tablet (10mg ) every morning. Fluticasone (Flonase): please use one spray in each nostril twice daily. Montelukast (Singulair): please take 1 tablet (10mg ) each evening  Azelastine (Astelin): please use 1 spray in each nostril at night on days when your congestion is particularly bad. Guaifenesin (Mucinex): continue using this as needed when your congestion is particularly bad.   I anticipate your symptoms will greatly improve with these medications. However, if they do not, please let me know and we can discuss further steps to take.   If you have lab work done today you will be contacted with your lab results within the next 2 weeks.  If you have not heard from then please contact . The fastest way to get your results is to register for My Chart.   IF you received an x-ray today, you will receive an invoice from [facility]. Please contact [facility] at [phone] with questions or concerns regarding your invoice.   IF you received labwork today, you will receive an invoice from [facility]. Please contact [facility] at [phone] with questions or concerns regarding your invoice.   Our billing staff will not be able to assist you with questions regarding bills from these companies.  You will be contacted with the lab results as soon as they are available. The fastest way to get your results is to activate your My Chart account. Instructions are located on the last page of this paperwork. If you have not heard from [facility] regarding the results in 2 weeks, please contact this office.

## 2019-04-04 ENCOUNTER — Encounter: Payer: Self-pay | Admitting: Registered Nurse

## 2019-04-04 NOTE — Progress Notes (Signed)
Established Patient Office Visit  Subjective:  Patient ID: Ryan Gutierrez, male    DOB: 06-16-1960  Age: 59 y.o. MRN: [id number]  CC:  Chief Complaint  Patient presents with  . Nasal Congestion    patient states he has been having a stuffy nose since last week , and states he has been working outside he know that could be the case.    HPI Ryan Gutierrez presents for rhinorrhea, nasal congestion Has been working outside last week or two - this seems to happen to him around this time each year History of seasonal allergies Some sinus pressure Some pressure in ears No cough, shob, doe, sick contacts, sensory changes, chest pain  No other complaints  Past Medical History:  Diagnosis Date  . Clotting disorder ([facility])   . DVT (deep venous thrombosis) ([facility])    post-knee surgery  . Hyperlipidemia   . Sickle cell trait [facility])     Past Surgical History:  Procedure Laterality Date  . HERNIA REPAIR    . KNEE SURGERY  1990s    Family History  Problem Relation Age of Onset  . Heart disease Mother   . Heart attack Mother   . Diabetes Sister   . Hypertension Sister   . Sickle cell anemia Son     Social History   Socioeconomic History  . Marital status: Married    Spouse name: Not on file  . Number of children: 2  . Years of education: Not on file  . Highest education level: Not on file  Occupational History  . Not on file  Tobacco Use  . Smoking status: Never Smoker  . Smokeless tobacco: Never Used  Substance and Sexual Activity  . Alcohol use: Yes    Alcohol/week: 0.0 standard drinks    Comment: occ  . Drug use: No  . Sexual activity: Yes  Other Topics Concern  . Not on file  Social History Narrative  . Not on file   Social Determinants of Health   Financial Resource Strain: Low Risk   . Difficulty of Paying Living Expenses: Not hard at all  Food Insecurity: No Food Insecurity  . Worried About Programme researcher, broadcasting/film/video in the Last Year: Never true  . Ran Out of  Food in the Last Year: Never true  Transportation Needs: No Transportation Needs  . Lack of Transportation (Medical): No  . Lack of Transportation (Non-Medical): No  Physical Activity: Insufficiently Active  . Days of Exercise per Week: 3 days  . Minutes of Exercise per Session: 30 min  Stress: No Stress Concern Present  . Feeling of Stress : Only a little  Social Connections: Unknown  . Frequency of Communication with Friends and Family: Three times a week  . Frequency of Social Gatherings with Friends and Family: Twice a week  . Attends Religious Services: Patient refused  . Active Member of Clubs or Organizations: Patient refused  . Attends Banker Meetings: Patient refused  . Marital Status: Patient refused  Intimate Partner Violence: Not At Risk  . Fear of Current or Ex-Partner: No  . Emotionally Abused: No  . Physically Abused: No  . Sexually Abused: No    Outpatient Medications Prior to Visit  Medication Sig Dispense Refill  . atorvastatin (LIPITOR) 40 MG tablet Take 1 tablet (40 mg total) by mouth daily. 90 tablet 3  . rivaroxaban (XARELTO) 20 MG TABS tablet Take 1 tablet (20 mg total) by mouth daily with supper. 90 tablet 3  No facility-administered medications prior to visit.    No Known Allergies  ROS Review of Systems  Constitutional: Negative.   HENT: Positive for congestion, rhinorrhea, sinus pressure and sneezing. Negative for dental problem, drooling, ear discharge, ear pain, facial swelling, hearing loss, mouth sores, nosebleeds, postnasal drip, sinus pain, sore throat, tinnitus, trouble swallowing and voice change.   Eyes: Negative.   Respiratory: Negative.   Cardiovascular: Negative.   Gastrointestinal: Negative.   Endocrine: Negative.   Genitourinary: Negative.   Musculoskeletal: Negative.   Skin: Negative.   Allergic/Immunologic: Negative.   Neurological: Negative.   Hematological: Negative.   Psychiatric/Behavioral: Negative.   All  other systems reviewed and are negative.     Objective:    Physical Exam  Constitutional: He is oriented to person, place, and time. He appears well-developed and well-nourished. No distress.  HENT:  Head: Normocephalic and atraumatic.  Eyes: Pupils are equal, round, and reactive to light. Conjunctivae and EOM are normal.  Cardiovascular: Normal rate and regular rhythm.  Pulmonary/Chest: Effort normal. No respiratory distress.  Musculoskeletal:     Cervical back: Normal range of motion.  Neurological: He is alert and oriented to person, place, and time.  Skin: Skin is warm and dry. No rash noted. He is not diaphoretic. No erythema. No pallor.  Psychiatric: He has a normal mood and affect. His behavior is normal. Judgment and thought content normal.  Nursing note and vitals reviewed.   BP 130/75   Pulse 72   Temp 98 F (36.7 C) (Temporal)   Ht 5\' 10"  (1.778 m)   Wt 216 lb 8 oz (98.2 kg)   SpO2 98%   BMI 31.06 kg/m  Wt Readings from Last 3 Encounters:  03/25/19 216 lb 8 oz (98.2 kg)  12/14/18 205 lb (93 kg)  10/09/17 197 lb (89.4 kg)     There are no preventive care reminders to display for this patient.  There are no preventive care reminders to display for this patient.  Lab Results  Component Value Date   TSH 2.320 12/14/2018   Lab Results  Component Value Date   WBC 5.8 12/14/2018   HGB 15.8 12/14/2018   HCT 46.4 12/14/2018   MCV 83 12/14/2018   PLT 209 12/14/2018   Lab Results  Component Value Date   NA 143 12/14/2018   K 4.6 12/14/2018   CO2 25 12/14/2018   GLUCOSE 107 (H) 12/14/2018   BUN 15 12/14/2018   CREATININE 1.11 12/14/2018   BILITOT 0.4 12/14/2018   ALKPHOS 122 (H) 12/14/2018   AST 28 12/14/2018   ALT 42 12/14/2018   PROT 8.4 12/14/2018   ALBUMIN 4.5 12/14/2018   CALCIUM 10.1 12/14/2018   Lab Results  Component Value Date   CHOL 260 (H) 12/14/2018   Lab Results  Component Value Date   HDL 48 12/14/2018   Lab Results  Component  Value Date   LDLCALC 193 (H) 12/14/2018   Lab Results  Component Value Date   TRIG 105 12/14/2018   Lab Results  Component Value Date   CHOLHDL 5.4 (H) 12/14/2018   Lab Results  Component Value Date   HGBA1C 5.9 (H) 12/14/2018      Assessment & Plan:   Problem List Items Addressed This Visit    None    Visit Diagnoses    Seasonal allergies    -  Primary   Relevant Medications   fluticasone (FLONASE) 50 MCG/ACT nasal spray   cetirizine (ZYRTEC) 10 MG tablet  montelukast (SINGULAIR) 10 MG tablet   azelastine (ASTELIN) 0.1 % nasal spray      Meds ordered this encounter  Medications  . fluticasone (FLONASE) 50 MCG/ACT nasal spray    Sig: Place 2 sprays into both nostrils daily.    Dispense:  16 g    Refill:  6    Order Specific Question:   Supervising Provider    Answer:   Delia Chimes A [id number]  . cetirizine (ZYRTEC) 10 MG tablet    Sig: Take 1 tablet (10 mg total) by mouth daily.    Dispense:  30 tablet    Refill:  11    Order Specific Question:   Supervising Provider    Answer:   Delia Chimes A [id number]  . montelukast (SINGULAIR) 10 MG tablet    Sig: Take 1 tablet (10 mg total) by mouth at bedtime.    Dispense:  30 tablet    Refill:  3    Order Specific Question:   Supervising Provider    Answer:   Delia Chimes A [id number]  . azelastine (ASTELIN) 0.1 % nasal spray    Sig: Place 1 spray into both nostrils 2 (two) times daily. Use in each nostril as directed    Dispense:  30 mL    Refill:  12    Order Specific Question:   Supervising Provider    Answer:   Forrest Moron [[id number]]    Follow-up: No follow-ups on file.   PLAN  Seasonal alleriges: zyrtec, singulair, azelastine, and fluticasone  Take all as needed daily for relief  Return if symptoms worsen or fail to improve  Patient encouraged to call clinic with any questions, comments, or concerns.  Maximiano Coss, NP

## 2019-12-14 ENCOUNTER — Other Ambulatory Visit: Payer: Self-pay | Admitting: Registered Nurse

## 2019-12-14 DIAGNOSIS — E785 Hyperlipidemia, unspecified: Secondary | ICD-10-CM

## 2019-12-14 NOTE — Telephone Encounter (Signed)
   Notes to clinic:   Patient requests 90 days supply  Requested Prescriptions  Pending Prescriptions Disp Refills   atorvastatin (LIPITOR) 40 MG tablet [Pharmacy Med Name: ATORVASTATIN 40MG  TABLETS] 90 tablet     Sig: TAKE 1 TABLET(40 MG) BY MOUTH DAILY      Cardiovascular:  Antilipid - Statins Failed - 12/14/2019  8:10 AM      Failed - Total Cholesterol in normal range and within 360 days    Cholesterol, Total  Date Value Ref Range Status  12/14/2018 260 (H) 100 - 199 mg/dL Final          Failed - LDL in normal range and within 360 days    LDL Chol Calc (NIH)  Date Value Ref Range Status  12/14/2018 193 (H) 0 - 99 mg/dL Final          Failed - HDL in normal range and within 360 days    HDL  Date Value Ref Range Status  12/14/2018 48 >39 mg/dL Final          Failed - Triglycerides in normal range and within 360 days    Triglycerides  Date Value Ref Range Status  12/14/2018 105 0 - 149 mg/dL Final          Passed - Patient is not pregnant      Passed - Valid encounter within last 12 months    Recent Outpatient Visits           8 months ago Seasonal allergies   Primary Care at 14/08/2018, Shelbie Ammons, NP   1 year ago Screening for endocrine, metabolic and immunity disorder   [facility], Shelbie Ammons, NP   2 years ago History of hemorrhoids   [facility] at [facility], Sunday Shams, MD   3 years ago Annual physical exam   [facility] at [facility], [place] D, [place]   3 years ago Long term (current) use of anticoagulants   Primary Care at [place], [place], [place]

## 2020-01-10 ENCOUNTER — Other Ambulatory Visit: Payer: Self-pay | Admitting: Registered Nurse

## 2020-01-10 DIAGNOSIS — Z86718 Personal history of other venous thrombosis and embolism: Secondary | ICD-10-CM

## 2020-01-10 DIAGNOSIS — Z7901 Long term (current) use of anticoagulants: Secondary | ICD-10-CM

## 2020-01-10 NOTE — Telephone Encounter (Signed)
01/10/2020 - PATIENT REQUESTING A REFILL ON HIS XARELTO 20 mg. I HAVE SCHEDULED HIS OFFICE VISIT FOR MEDICATION REVIEW WITH RICH MORROW ON Friday 01/13/2020 AT 10:50 am. I DO NOT HAVE TO ROUTE BACK TO THE CLINICAL TEAM AT THIS TIME BECAUSE FELICIA K. SENT IN A 30 DAY SUPPLY. MBC

## 2020-01-10 NOTE — Telephone Encounter (Signed)
No further refills without office visit 

## 2020-01-10 NOTE — Telephone Encounter (Signed)
Please schedule a f/u appt for med refills 30 day supply has been sent

## 2020-01-13 ENCOUNTER — Ambulatory Visit: Payer: Federal, State, Local not specified - PPO | Admitting: Registered Nurse

## 2020-01-13 ENCOUNTER — Other Ambulatory Visit: Payer: Self-pay

## 2020-01-13 ENCOUNTER — Encounter: Payer: Self-pay | Admitting: Registered Nurse

## 2020-01-13 VITALS — BP 128/62 | HR 69 | Temp 97.7°F | Ht 70.5 in | Wt 220.0 lb

## 2020-01-13 DIAGNOSIS — Z7901 Long term (current) use of anticoagulants: Secondary | ICD-10-CM

## 2020-01-13 DIAGNOSIS — Z86718 Personal history of other venous thrombosis and embolism: Secondary | ICD-10-CM | POA: Diagnosis not present

## 2020-01-13 DIAGNOSIS — E785 Hyperlipidemia, unspecified: Secondary | ICD-10-CM

## 2020-01-13 NOTE — Patient Instructions (Signed)
° ° ° °  If you have lab work done today you will be contacted with your lab results within the next 2 weeks.  If you have not heard from us then please contact us. The fastest way to get your results is to register for My Chart. ° ° °IF you received an x-ray today, you will receive an invoice from [facility]. Please contact [facility] at [phone] with questions or concerns regarding your invoice.  ° °IF you received labwork today, you will receive an invoice from [facility]. Please contact [facility] at [phone] with questions or concerns regarding your invoice.  ° °Our billing staff will not be able to assist you with questions regarding bills from these companies. ° °You will be contacted with the lab results as soon as they are available. The fastest way to get your results is to activate your My Chart account. Instructions are located on the last page of this paperwork. If you have not heard from us regarding the results in 2 weeks, please contact this office. °  ° ° ° °

## 2020-01-14 ENCOUNTER — Other Ambulatory Visit: Payer: Self-pay | Admitting: Registered Nurse

## 2020-01-14 DIAGNOSIS — E785 Hyperlipidemia, unspecified: Secondary | ICD-10-CM

## 2020-01-14 LAB — COMPREHENSIVE METABOLIC PANEL
ALT: 29 IU/L (ref 0–44)
AST: 22 IU/L (ref 0–40)
Albumin/Globulin Ratio: 1.3 (ref 1.2–2.2)
Albumin: 4.2 g/dL (ref 3.8–4.9)
Alkaline Phosphatase: 101 IU/L (ref 44–121)
BUN/Creatinine Ratio: 12 (ref 9–20)
BUN: 15 mg/dL (ref 6–24)
Bilirubin Total: 0.8 mg/dL (ref 0.0–1.2)
CO2: 26 mmol/L (ref 20–29)
Calcium: 9.7 mg/dL (ref 8.7–10.2)
Chloride: 101 mmol/L (ref 96–106)
Creatinine, Ser: 1.24 mg/dL (ref 0.76–1.27)
GFR calc Af Amer: 74 mL/min/{1.73_m2} (ref >59)
GFR calc non Af Amer: 64 mL/min/{1.73_m2} (ref >59)
Globulin, Total: 3.3 g/dL (ref 1.5–4.5)
Glucose: 99 mg/dL (ref 65–99)
Potassium: 4.1 mmol/L (ref 3.5–5.2)
Sodium: 141 mmol/L (ref 134–144)
Total Protein: 7.5 g/dL (ref 6.0–8.5)

## 2020-01-14 LAB — HEMOGLOBIN A1C
Est. average glucose Bld gHb Est-mCnc: 128 mg/dL
Hgb A1c MFr Bld: 6.1 % — ABNORMAL HIGH (ref 4.8–5.6)

## 2020-01-14 LAB — CBC WITH DIFFERENTIAL
Basophils Absolute: 0 10*3/uL (ref 0.0–0.2)
Basos: 0 %
EOS (ABSOLUTE): 0.2 10*3/uL (ref 0.0–0.4)
Eos: 3 %
Hematocrit: 44.6 % (ref 37.5–51.0)
Hemoglobin: 15.5 g/dL (ref 13.0–17.7)
Immature Grans (Abs): 0 10*3/uL (ref 0.0–0.1)
Immature Granulocytes: 0 %
Lymphocytes Absolute: 1 10*3/uL (ref 0.7–3.1)
Lymphs: 19 %
MCH: 29.1 pg (ref 26.6–33.0)
MCHC: 34.8 g/dL (ref 31.5–35.7)
MCV: 84 fL (ref 79–97)
Monocytes Absolute: 0.5 10*3/uL (ref 0.1–0.9)
Monocytes: 10 %
Neutrophils Absolute: 3.3 10*3/uL (ref 1.4–7.0)
Neutrophils: 68 %
RBC: 5.32 x10E6/uL (ref 4.14–5.80)
RDW: 12.7 % (ref 11.6–15.4)
WBC: 4.9 10*3/uL (ref 3.4–10.8)

## 2020-01-14 LAB — LIPID PANEL
Chol/HDL Ratio: 3.2 ratio (ref 0.0–5.0)
Cholesterol, Total: 148 mg/dL (ref 100–199)
HDL: 46 mg/dL (ref >39)
LDL Chol Calc (NIH): 83 mg/dL (ref 0–99)
Triglycerides: 101 mg/dL (ref 0–149)
VLDL Cholesterol Cal: 19 mg/dL (ref 5–40)

## 2020-01-14 NOTE — Progress Notes (Signed)
Letter please -   Mr. Kueker -   A1c is up to 6.1. we're slowly heading towards a diabetic range. Please adjust your diet and exercise regularly so we can avoid this. Let me know if you have concerns,  Thanks,  Luan Pulling

## 2020-01-17 ENCOUNTER — Encounter: Payer: Self-pay | Admitting: Radiology

## 2020-02-10 ENCOUNTER — Other Ambulatory Visit: Payer: Self-pay | Admitting: Registered Nurse

## 2020-02-10 DIAGNOSIS — Z86718 Personal history of other venous thrombosis and embolism: Secondary | ICD-10-CM

## 2020-02-10 DIAGNOSIS — Z7901 Long term (current) use of anticoagulants: Secondary | ICD-10-CM

## 2020-03-26 ENCOUNTER — Ambulatory Visit (INDEPENDENT_AMBULATORY_CARE_PROVIDER_SITE_OTHER): Payer: Federal, State, Local not specified - PPO | Admitting: Registered Nurse

## 2020-03-26 ENCOUNTER — Encounter: Payer: Self-pay | Admitting: Registered Nurse

## 2020-03-26 ENCOUNTER — Other Ambulatory Visit: Payer: Self-pay

## 2020-03-26 VITALS — BP 131/79 | HR 60 | Temp 98.0°F | Resp 18 | Ht 70.5 in | Wt 216.6 lb

## 2020-03-26 DIAGNOSIS — Z0001 Encounter for general adult medical examination with abnormal findings: Secondary | ICD-10-CM | POA: Diagnosis not present

## 2020-03-26 DIAGNOSIS — R310 Gross hematuria: Secondary | ICD-10-CM

## 2020-03-26 DIAGNOSIS — Z0000 Encounter for general adult medical examination without abnormal findings: Secondary | ICD-10-CM

## 2020-03-26 LAB — POCT URINALYSIS DIP (CLINITEK)
Bilirubin, UA: NEGATIVE
Glucose, UA: NEGATIVE mg/dL
Ketones, POC UA: NEGATIVE mg/dL
Leukocytes, UA: NEGATIVE
Nitrite, UA: NEGATIVE
POC PROTEIN,UA: NEGATIVE
Spec Grav, UA: 1.015 (ref 1.010–1.025)
Urobilinogen, UA: 0.2 E.U./dL
pH, UA: 5.5 (ref 5.0–8.0)

## 2020-03-26 NOTE — Patient Instructions (Addendum)
[facility] [address], [address] [place], [place] [address]     [facility] [address] [place], [place] [address]        If you have lab work done today you will be contacted with your lab results within the next 2 weeks.  If you have not heard from us then please contact us. The fastest way to get your results is to register for My Chart.   IF you received an x-ray today, you will receive an invoice from [facility]. Please contact [facility] at [phone] with questions or concerns regarding your invoice.   IF you received labwork today, you will receive an invoice from [facility]. Please contact [facility] at [phone] with questions or concerns regarding your invoice.   Our billing staff will not be able to assist you with questions regarding bills from these companies.  You will be contacted with the lab results as soon as they are available. The fastest way to get your results is to activate your My Chart account. Instructions are located on the last page of this paperwork. If you have not heard from us regarding the results in 2 weeks, please contact this office.      

## 2020-03-26 NOTE — Progress Notes (Signed)
Established Patient Office Visit  Subjective:  Patient ID: Ryan Gutierrez, male    DOB: 08-06-1960  Age: 60 y.o. MRN: [id number]  CC:  Chief Complaint  Patient presents with  . Annual Exam    Patient states he is here for a CPE.    HPI Ryan Gutierrez presents for CPE  C/o gross hematuria. On and off for a few months Painless, no frequency, urgency, or incontinence No abdominal pain or flank pain  Does endorse some blood in stool as well - both some bright red and dark red stool, states occasional dark tarry stool. No weight changes. No bismuth or iron use. No abdominal pain. No diarrhea or constipation. No nausea or vomiting. Had colonoscopy at age 61 that noted internal hemorrhoids, no other abnormalities.   Hx of clotting d/o, is taking xarelto daily. No concerns in past - though occ epistaxis that is easily controlled.   Otherwise feeling well. No other concerns   Past Medical History:  Diagnosis Date  . Clotting disorder (HCC)   . DVT (deep venous thrombosis) (HCC)    post-knee surgery  . Hyperlipidemia   . Sickle cell trait [facility])     Past Surgical History:  Procedure Laterality Date  . HERNIA REPAIR    . KNEE SURGERY  1990s    Family History  Problem Relation Age of Onset  . Heart disease Mother   . Heart attack Mother   . Diabetes Sister   . Hypertension Sister   . Sickle cell anemia Son     Social History   Socioeconomic History  . Marital status: Married    Spouse name: Not on file  . Number of children: 2  . Years of education: Not on file  . Highest education level: Not on file  Occupational History  . Not on file  Tobacco Use  . Smoking status: Never Smoker  . Smokeless tobacco: Never Used  Vaping Use  . Vaping Use: Never used  Substance and Sexual Activity  . Alcohol use: Yes    Alcohol/week: 0.0 standard drinks    Comment: occ  . Drug use: No  . Sexual activity: Yes  Other Topics Concern  . Not on file  Social History Narrative  .  Not on file   Social Determinants of Health   Financial Resource Strain: Not on file  Food Insecurity: Not on file  Transportation Needs: Not on file  Physical Activity: Not on file  Stress: Not on file  Social Connections: Not on file  Intimate Partner Violence: Not on file    Outpatient Medications Prior to Visit  Medication Sig Dispense Refill  . atorvastatin (LIPITOR) 40 MG tablet TAKE 1 TABLET(40 MG) BY MOUTH DAILY 90 tablet 3  . XARELTO 20 MG TABS tablet TAKE 1 TABLET(20 MG) BY MOUTH DAILY WITH SUPPER 30 tablet 2   No facility-administered medications prior to visit.    No Known Allergies  ROS Review of Systems  Constitutional: Negative.   HENT: Negative.   Eyes: Negative.   Respiratory: Negative.   Cardiovascular: Negative.   Gastrointestinal: Negative.   Genitourinary: Negative.   Musculoskeletal: Negative.   Skin: Negative.   Neurological: Negative.   Psychiatric/Behavioral: Negative.   All other systems reviewed and are negative.     Objective:    Physical Exam Vitals and nursing note reviewed.  Constitutional:      General: He is not in acute distress.    Appearance: Normal appearance. He is normal weight. He  is not ill-appearing, toxic-appearing or diaphoretic.  HENT:     Head: Normocephalic and atraumatic.     Right Ear: Tympanic membrane, ear canal and external ear normal. There is no impacted cerumen.     Left Ear: Tympanic membrane, ear canal and external ear normal. There is no impacted cerumen.     Nose: Nose normal. No congestion or rhinorrhea.     Mouth/Throat:     Mouth: Mucous membranes are moist.     Pharynx: Oropharynx is clear. No oropharyngeal exudate or posterior oropharyngeal erythema.  Eyes:     General: No scleral icterus.       Right eye: No discharge.        Left eye: No discharge.     Extraocular Movements: Extraocular movements intact.     Conjunctiva/sclera: Conjunctivae normal.     Pupils: Pupils are equal, round, and  reactive to light.  Neck:     Vascular: No carotid bruit.  Cardiovascular:     Rate and Rhythm: Normal rate and regular rhythm.     Pulses: Normal pulses.     Heart sounds: Normal heart sounds. No murmur heard. No friction rub. No gallop.   Pulmonary:     Effort: Pulmonary effort is normal. No respiratory distress.     Breath sounds: Normal breath sounds. No stridor. No wheezing, rhonchi or rales.  Chest:     Chest wall: No tenderness.  Abdominal:     General: Abdomen is flat. Bowel sounds are normal. There is no distension.     Palpations: Abdomen is soft. There is no mass.     Tenderness: There is no abdominal tenderness. There is no right CVA tenderness, left CVA tenderness, guarding or rebound.     Hernia: No hernia is present.  Musculoskeletal:        General: No swelling, tenderness, deformity or signs of injury. Normal range of motion.     Cervical back: Normal range of motion and neck supple. No rigidity or tenderness.     Right lower leg: No edema.     Left lower leg: No edema.  Lymphadenopathy:     Cervical: No cervical adenopathy.  Skin:    General: Skin is warm and dry.     Capillary Refill: Capillary refill takes less than 2 seconds.     Coloration: Skin is not jaundiced or pale.     Findings: No bruising, erythema, lesion or rash.  Neurological:     General: No focal deficit present.     Mental Status: He is alert and oriented to person, place, and time. Mental status is at baseline.     Cranial Nerves: No cranial nerve deficit.     Motor: No weakness.     Gait: Gait normal.  Psychiatric:        Mood and Affect: Mood normal.        Behavior: Behavior normal.        Thought Content: Thought content normal.        Judgment: Judgment normal.     BP 131/79   Pulse 60   Temp 98 F (36.7 C) (Temporal)   Resp 18   Ht 5' 10.5" (1.791 m)   Wt 216 lb 9.6 oz (98.2 kg)   SpO2 98%   BMI 30.64 kg/m  Wt Readings from Last 3 Encounters:  03/26/20 216 lb 9.6 oz  (98.2 kg)  01/13/20 220 lb (99.8 kg)  03/25/19 216 lb 8 oz (98.2 kg)  There are no preventive care reminders to display for this patient.  There are no preventive care reminders to display for this patient.  Lab Results  Component Value Date   TSH 2.320 12/14/2018   Lab Results  Component Value Date   WBC 4.9 01/13/2020   HGB 15.5 01/13/2020   HCT 44.6 01/13/2020   MCV 84 01/13/2020   PLT 209 12/14/2018   Lab Results  Component Value Date   NA 141 01/13/2020   K 4.1 01/13/2020   CO2 26 01/13/2020   GLUCOSE 99 01/13/2020   BUN 15 01/13/2020   CREATININE 1.24 01/13/2020   BILITOT 0.8 01/13/2020   ALKPHOS 101 01/13/2020   AST 22 01/13/2020   ALT 29 01/13/2020   PROT 7.5 01/13/2020   ALBUMIN 4.2 01/13/2020   CALCIUM 9.7 01/13/2020   Lab Results  Component Value Date   CHOL 148 01/13/2020   Lab Results  Component Value Date   HDL 46 01/13/2020   Lab Results  Component Value Date   LDLCALC 83 01/13/2020   Lab Results  Component Value Date   TRIG 101 01/13/2020   Lab Results  Component Value Date   CHOLHDL 3.2 01/13/2020   Lab Results  Component Value Date   HGBA1C 6.1 (H) 01/13/2020      Assessment & Plan:   Problem List Items Addressed This Visit   None   Visit Diagnoses    Annual physical exam    -  Primary   Gross hematuria       Relevant Orders   POCT URINALYSIS DIP (CLINITEK) (Completed)   CT Abdomen Pelvis Wo Contrast      No orders of the defined types were placed in this encounter.   Follow-up: No follow-ups on file.   PLAN  poct UA shows some blood in urine, will order CT abd and pelvis. Stool changes are concerning for GI bleed but given that pt reports intermittent changes, no further red flags, and overall poor historian - will start with CT abd pelvis. Strict ER precautions.  Otherwise, exam unremarkable.   Patient encouraged to call clinic with any questions, comments, or concerns.  Janeece Agee, NP

## 2020-04-05 ENCOUNTER — Ambulatory Visit
Admission: RE | Admit: 2020-04-05 | Discharge: 2020-04-05 | Disposition: A | Discharge status: Home / Self Care | Payer: Federal, State, Local not specified - PPO | Source: Ambulatory Visit | Attending: Registered Nurse | Admitting: Registered Nurse

## 2020-04-05 DIAGNOSIS — R310 Gross hematuria: Secondary | ICD-10-CM

## 2020-04-05 DIAGNOSIS — R319 Hematuria, unspecified: Secondary | ICD-10-CM | POA: Diagnosis not present

## 2020-04-05 DIAGNOSIS — K8080 Other cholelithiasis without obstruction: Secondary | ICD-10-CM | POA: Diagnosis not present

## 2020-04-05 DIAGNOSIS — K8020 Calculus of gallbladder without cholecystitis without obstruction: Secondary | ICD-10-CM | POA: Diagnosis not present

## 2020-04-12 ENCOUNTER — Other Ambulatory Visit: Payer: Self-pay | Admitting: Registered Nurse

## 2020-04-12 ENCOUNTER — Telehealth (INDEPENDENT_AMBULATORY_CARE_PROVIDER_SITE_OTHER): Payer: Federal, State, Local not specified - PPO | Admitting: Family Medicine

## 2020-04-12 ENCOUNTER — Telehealth: Payer: Self-pay | Admitting: Registered Nurse

## 2020-04-12 DIAGNOSIS — R059 Cough, unspecified: Secondary | ICD-10-CM

## 2020-04-12 DIAGNOSIS — N281 Cyst of kidney, acquired: Secondary | ICD-10-CM

## 2020-04-12 MED ORDER — BENZONATATE 100 MG PO CAPS: 100.0000 mg | ORAL_CAPSULE | Freq: Three times a day (TID) | ORAL | 0 refills | Status: DC | PRN

## 2020-04-12 NOTE — Progress Notes (Signed)
Virtual Visit via Telephone Note  I connected with Ryan Gutierrez on 04/12/20 at  4:20 PM EDT by telephone and verified that I am speaking with the correct person using two identifiers.   I discussed the limitations, risks, security and privacy concerns of performing an evaluation and management service by telephone and the availability of in person appointments. I also discussed with the patient that there may be a patient responsible charge related to this service. The patient expressed understanding and agreed to proceed.  Location patient: home, [place] Location provider: work or home office Participants present for the call: patient, provider Patient did not have a visit with me in the prior 7 days to address this/these issue(s).   History of Present Illness:  Acute telemedicine visit for cough and congestion: -Onset: 4- 5 days ago -Symptoms include: nasal congestion, scratchy throat, cough, low grade fever the first 2 days -reports he feels he is doing much better today -Denies: CP, SOB, NVD, inability to eat/drink/get out of bed -Has tried: musinex -Pertinent past medical history: gets allergies this time of the year -Pertinent medication allergies: nkda -COVID-19 vaccine status: had covid shots and booster, had flu shot   Observations/Objective: Patient sounds cheerful and well on the phone. I do not appreciate any SOB. Speech and thought processing are grossly intact. Patient reported vitals:  Assessment and Plan:  Cough  -we discussed possible serious and likely etiologies, options for evaluation and workup, limitations of telemedicine visit vs in person visit, treatment, treatment risks and precautions. Pt prefers to treat via telemedicine empirically rather than in person at this moment.  Query VURI, influenza, covid vs other. Reports is doing better today, thankfully, so opted for Tessalon for cough and symptomatic care measures per patient instructions.  Plans to do a Covid  test, advised to schedule follow-up video visit with [place] or primary care office positive. Work/School slipped offered:   declined Scheduled follow up with PCP offered: Agrees to schedule follow-up if needed. Advised to seek prompt in person care if worsening, new symptoms arise, or if is not improving with treatment. Advised of options for inperson care in case PCP office not available. Did let the patient know that I only do telemedicine shifts for [place] on Tuesdays and Thursdays and advised a follow up visit with PCP or at an [facility] if has further questions or concerns.   Follow Up Instructions:  I did not refer this patient for an OV with me in the next 24 hours for this/these issue(s).  I discussed the assessment and treatment plan with the patient. The patient was provided an opportunity to ask questions and all were answered. The patient agreed with the plan and demonstrated an understanding of the instructions.   I spent 15 minutes on the date of this visit in the care of this patient. See summary of tasks completed to properly care for this patient in the detailed notes above which also included counseling of above, review of PMH, medications, allergies, evaluation of the patient and ordering and/or  instructing patient on testing and care options.     Terressa Koyanagi, DO

## 2020-04-12 NOTE — Patient Instructions (Signed)
  HOME CARE TIPS:  -[place] COVID19 testing information: [email or web address] OR [phone] Most pharmacies also offer testing and home test kits. If you get a positive covid test schedule a follow up video visit with your primary care doctor or with [facility].  -I sent the medication(s) we discussed to your pharmacy: Meds ordered this encounter  Medications  . benzonatate (TESSALON PERLES) 100 MG capsule    Sig: Take 1 capsule (100 mg total) by mouth 3 (three) times daily as needed.    Dispense:  20 capsule    Refill:  0     -can use tylenol if needed for fevers, aches and pains per instructions  -can use nasal saline a few times per day if you have nasal congestion  -stay hydrated, drink plenty of fluids and eat small healthy meals - avoid dairy  -If the Covid test is positive, check out the CDC website for more information on home care, transmission and treatment for COVID19  -follow up with your doctor in 2-3 days unless improving and feeling better  -stay home while sick, except to seek medical care, and if you have COVID19 ideally it would be best to stay home for a full 10 days since the onset of symptoms PLUS one day of no fever and feeling better. Wear a good mask (such as N95 or KN95) if around others to reduce the risk of transmission.  It was nice to meet you today, and I really hope you are feeling better soon. I help [facility] out with telemedicine visits on Tuesdays and Thursdays and am available for visits on those days. If you have any concerns or questions following this visit please schedule a follow up visit with your Primary Care doctor or seek care at a local urgent care clinic to avoid delays in care.    Seek in person care or schedule a follow up video visit promptly if your symptoms worsen, new concerns arise or you are not improving with treatment. Call 911 and/or seek emergency care if your symptoms are severe or  life threatening.

## 2020-04-12 NOTE — Telephone Encounter (Signed)
Patient called back and asked that you return his call.

## 2020-04-12 NOTE — Telephone Encounter (Signed)
Called pt with CT results, LVM Needs CT w contrast to follow up on hypodense lesions in kidneys. Likely cysts, but with hematuria will rule out malignancy  Jari Sportsman, NP

## 2020-04-25 ENCOUNTER — Ambulatory Visit (INDEPENDENT_AMBULATORY_CARE_PROVIDER_SITE_OTHER)
Admission: RE | Admit: 2020-04-25 | Discharge: 2020-04-25 | Disposition: A | Discharge status: Home / Self Care | Payer: Federal, State, Local not specified - PPO | Source: Ambulatory Visit | Attending: Registered Nurse | Admitting: Registered Nurse

## 2020-04-25 ENCOUNTER — Other Ambulatory Visit: Payer: Self-pay

## 2020-04-25 DIAGNOSIS — N281 Cyst of kidney, acquired: Secondary | ICD-10-CM | POA: Diagnosis not present

## 2020-04-25 DIAGNOSIS — K8020 Calculus of gallbladder without cholecystitis without obstruction: Secondary | ICD-10-CM | POA: Diagnosis not present

## 2020-04-25 MED ORDER — IOHEXOL 300 MG/ML  SOLN
100.0000 mL | Freq: Once | INTRAMUSCULAR | Status: AC | PRN
  Administered 2020-04-25: 100 mL via INTRAVENOUS

## 2020-04-27 ENCOUNTER — Telehealth: Payer: Self-pay | Admitting: Registered Nurse

## 2020-04-27 NOTE — Telephone Encounter (Signed)
Patient wants results of his recent cat scan - Please call

## 2020-05-01 NOTE — Telephone Encounter (Signed)
Pt called in asking for results for his CT scan that he had done on Wednesday of last week,

## 2020-05-02 ENCOUNTER — Encounter: Payer: Self-pay | Admitting: Registered Nurse

## 2020-05-02 ENCOUNTER — Other Ambulatory Visit (HOSPITAL_COMMUNITY)
Admission: RE | Admit: 2020-05-02 | Discharge: 2020-05-02 | Disposition: A | Discharge status: Home / Self Care | Payer: Federal, State, Local not specified - PPO | Source: Ambulatory Visit | Attending: Registered Nurse | Admitting: Registered Nurse

## 2020-05-02 ENCOUNTER — Other Ambulatory Visit: Payer: Self-pay

## 2020-05-02 ENCOUNTER — Ambulatory Visit: Payer: Federal, State, Local not specified - PPO | Admitting: Registered Nurse

## 2020-05-02 ENCOUNTER — Other Ambulatory Visit: Payer: Self-pay | Admitting: Registered Nurse

## 2020-05-02 VITALS — BP 136/82 | HR 63 | Temp 97.5°F | Ht 70.5 in | Wt 214.4 lb

## 2020-05-02 DIAGNOSIS — Z202 Contact with and (suspected) exposure to infections with a predominantly sexual mode of transmission: Secondary | ICD-10-CM | POA: Diagnosis not present

## 2020-05-02 DIAGNOSIS — Z113 Encounter for screening for infections with a predominantly sexual mode of transmission: Secondary | ICD-10-CM

## 2020-05-02 DIAGNOSIS — Z86718 Personal history of other venous thrombosis and embolism: Secondary | ICD-10-CM

## 2020-05-02 DIAGNOSIS — Z7901 Long term (current) use of anticoagulants: Secondary | ICD-10-CM

## 2020-05-02 MED ORDER — RIVAROXABAN 20 MG PO TABS: ORAL_TABLET | ORAL | 2 refills | Status: DC

## 2020-05-02 MED ORDER — METRONIDAZOLE 500 MG PO TABS: 500.0000 mg | ORAL_TABLET | Freq: Two times a day (BID) | ORAL | 0 refills | Status: DC

## 2020-05-02 NOTE — Progress Notes (Signed)
Established Patient Office Visit  Subjective:  Patient ID: Ryan Gutierrez, male    DOB: [birth year]  Age: 60 y.o. MRN: [id number]  CC:  Chief Complaint  Patient presents with  . Exposure to STD    HPI Ryan Gutierrez presents for known exposure to STI  Partner tested positive for trichomonas He has no symptoms Would like full testing today  No hx of abx intolerance  Past Medical History:  Diagnosis Date  . Clotting disorder (HCC)   . DVT (deep venous thrombosis) (HCC)    post-knee surgery  . Hyperlipidemia   . Sickle cell trait [facility])     Past Surgical History:  Procedure Laterality Date  . HERNIA REPAIR    . KNEE SURGERY  1990s    Family History  Problem Relation Age of Onset  . Heart disease Mother   . Heart attack Mother   . Diabetes Sister   . Hypertension Sister   . Sickle cell anemia Son     Social History   Socioeconomic History  . Marital status: Married    Spouse name: Not on file  . Number of children: 2  . Years of education: Not on file  . Highest education level: Not on file  Occupational History  . Not on file  Tobacco Use  . Smoking status: Never Smoker  . Smokeless tobacco: Never Used  Vaping Use  . Vaping Use: Never used  Substance and Sexual Activity  . Alcohol use: Yes    Alcohol/week: 0.0 standard drinks    Comment: occ  . Drug use: No  . Sexual activity: Yes  Other Topics Concern  . Not on file  Social History Narrative  . Not on file   Social Determinants of Health   Financial Resource Strain: Not on file  Food Insecurity: Not on file  Transportation Needs: Not on file  Physical Activity: Not on file  Stress: Not on file  Social Connections: Not on file  Intimate Partner Violence: Not on file    Outpatient Medications Prior to Visit  Medication Sig Dispense Refill  . atorvastatin (LIPITOR) 40 MG tablet TAKE 1 TABLET(40 MG) BY MOUTH DAILY 90 tablet 3  . benzonatate (TESSALON PERLES) 100 MG capsule Take 1 capsule  (100 mg total) by mouth 3 (three) times daily as needed. 20 capsule 0  . XARELTO 20 MG TABS tablet TAKE 1 TABLET(20 MG) BY MOUTH DAILY WITH SUPPER 30 tablet 2   No facility-administered medications prior to visit.    No Known Allergies  ROS Review of Systems  Constitutional: Negative.   HENT: Negative.   Eyes: Negative.   Respiratory: Negative.   Cardiovascular: Negative.   Gastrointestinal: Negative.   Genitourinary: Negative.   Musculoskeletal: Negative.   Skin: Negative.   Neurological: Negative.   Psychiatric/Behavioral: Negative.   All other systems reviewed and are negative.     Objective:    Physical Exam Constitutional:      General: He is not in acute distress.    Appearance: Normal appearance. He is normal weight. He is not ill-appearing, toxic-appearing or diaphoretic.  Cardiovascular:     Rate and Rhythm: Normal rate and regular rhythm.     Heart sounds: Normal heart sounds. No murmur heard. No friction rub. No gallop.   Pulmonary:     Effort: Pulmonary effort is normal. No respiratory distress.     Breath sounds: Normal breath sounds. No stridor. No wheezing, rhonchi or rales.  Chest:     Chest  wall: No tenderness.  Neurological:     General: No focal deficit present.     Mental Status: He is alert and oriented to person, place, and time. Mental status is at baseline.  Psychiatric:        Mood and Affect: Mood normal.        Behavior: Behavior normal.        Thought Content: Thought content normal.        Judgment: Judgment normal.     BP 136/82   Pulse 63   Temp (!) 97.5 F (36.4 C)   Ht 5' 10.5" (1.791 m)   Wt 214 lb 6.1 oz (97.2 kg)   SpO2 98%   BMI 30.33 kg/m  Wt Readings from Last 3 Encounters:  05/02/20 214 lb 6.1 oz (97.2 kg)  03/26/20 216 lb 9.6 oz (98.2 kg)  01/13/20 220 lb (99.8 kg)     Health Maintenance Due  Topic Date Due  . COVID-19 Vaccine (1) Never done    There are no preventive care reminders to display for this  patient.  Lab Results  Component Value Date   TSH 2.320 12/14/2018   Lab Results  Component Value Date   WBC 4.9 01/13/2020   HGB 15.5 01/13/2020   HCT 44.6 01/13/2020   MCV 84 01/13/2020   PLT 209 12/14/2018   Lab Results  Component Value Date   NA 141 01/13/2020   K 4.1 01/13/2020   CO2 26 01/13/2020   GLUCOSE 99 01/13/2020   BUN 15 01/13/2020   CREATININE 1.24 01/13/2020   BILITOT 0.8 01/13/2020   ALKPHOS 101 01/13/2020   AST 22 01/13/2020   ALT 29 01/13/2020   PROT 7.5 01/13/2020   ALBUMIN 4.2 01/13/2020   CALCIUM 9.7 01/13/2020   Lab Results  Component Value Date   CHOL 148 01/13/2020   Lab Results  Component Value Date   HDL 46 01/13/2020   Lab Results  Component Value Date   LDLCALC 83 01/13/2020   Lab Results  Component Value Date   TRIG 101 01/13/2020   Lab Results  Component Value Date   CHOLHDL 3.2 01/13/2020   Lab Results  Component Value Date   HGBA1C 6.1 (H) 01/13/2020      Assessment & Plan:   Problem List Items Addressed This Visit   None   Visit Diagnoses    Trichomonas exposure    -  Primary   Relevant Medications   metroNIDAZOLE (FLAGYL) 500 MG tablet   Other Relevant Orders   Urine cytology ancillary only([place])   Routine screening for STI (sexually transmitted infection)       Relevant Orders   Urine cytology ancillary only([place])   HIV antibody (with reflex)   RPR      Meds ordered this encounter  Medications  . metroNIDAZOLE (FLAGYL) 500 MG tablet    Sig: Take 1 tablet (500 mg total) by mouth 2 (two) times daily.    Dispense:  14 tablet    Refill:  0    Order Specific Question:   Supervising Provider    Answer:   Neva Seat, JEFFREY R [[id number]]    Follow-up: No follow-ups on file.   PLAN  Metronidazole 500mg  PO bid for 7 days  Condoms or abstaining for 1 week  sti testing sent  Return q3-6 mo for screening  Patient encouraged to call clinic with any questions, comments, or concerns.  , NP

## 2020-05-02 NOTE — Telephone Encounter (Signed)
Can we send in the Xarelto for the pt to [facility] on [address] saw richard today

## 2020-05-02 NOTE — Patient Instructions (Addendum)
Ryan Gutierrez -   Always great to see you.  We treat trichomonas with metronidazole 500mg  pills twice a day for one week Try to avoid alcohol while using this medication as it will upset your stomach  We generally try to repeat testing every 3-6 mo  As far as your CT goes, results were great. Very reassuring with only some benign cysts in the liver and kidneys. These are fairly common and usually incidental findings.  Let me know if you have any further concerns or questions  Results should be back within 1 week.  Thanks,  Rich     Trichomoniasis Trichomoniasis is an STI (sexually transmitted infection) that can affect both women and men. In women, the outer area of the male genitalia (vulva) and the vagina are affected. In men, mainly the penis is affected, but the prostate and other reproductive organs can also be involved.  This condition can be treated with medicine. It often has no symptoms (is asymptomatic), especially in men. If not treated, trichomoniasis can last for months or years. What are the causes? This condition is caused by a parasite called Trichomonas vaginalis. Trichomoniasis most often spreads from person to person (is contagious) through sexual contact. What increases the risk? The following factors may make you more likely to develop this condition:  Having unprotected sex.  Having sex with a partner who has trichomoniasis.  Having multiple sexual partners.  Having had previous trichomoniasis infections or other STIs. What are the signs or symptoms? In women, symptoms of trichomoniasis include:  Abnormal vaginal discharge that is clear, white, gray, or yellow-green and foamy and has an unusual "fishy" odor.  Itching and irritation of the vagina and vulva.  Burning or pain during urination or sex.  Redness and swelling of the genitals. In men, symptoms of trichomoniasis include:  Penile discharge that may be foamy or contain pus.  Pain in the  penis. This may happen only when urinating.  Itching or irritation inside the penis.  Burning after urination or ejaculation. How is this diagnosed? In women, this condition may be found during a routine Pap test or physical exam. It may be found in men during a routine physical exam. Your health care provider may do tests to help diagnose this infection, such as:  Urine tests (men and women).  The following in women: ? Testing the pH of the vagina. ? A vaginal swab test that checks for the Trichomonas vaginalis parasite. ? Testing vaginal secretions. Your health care provider may test you for other STIs, including HIV (human immunodeficiency virus). How is this treated? This condition is treated with medicine taken by mouth (orally), such as metronidazole or tinidazole, to fight the infection. Your sexual partner(s) also need to be tested and treated.  If you are a woman and you plan to become pregnant or think you may be pregnant, tell your health care provider right away. Some medicines that are used to treat the infection should not be taken during pregnancy. Your health care provider may recommend over-the-counter medicines or creams to help relieve itching or irritation. You may be tested for infection again 3 months after treatment.   Follow these instructions at home:  Take and use over-the-counter and prescription medicines, including creams, only as told by your health care provider.  Take your antibiotic medicine as told by your health care provider. Do not stop taking the antibiotic even if you start to feel better.  Do not have sex until 7-10 days after  you finish your medicine, or until your health care provider approves. Ask your health care provider when you may start to have sex again.  (Women) Do not douche or wear tampons while you have the infection.  Discuss your infection with your sexual partner(s). Make sure that your partner gets tested and treated, if  necessary.  Keep all follow-up visits as told by your health care provider. This is important. How is this prevented?  Use condoms every time you have sex. Using condoms correctly and consistently can help protect against STIs.  Avoid having multiple sexual partners.  Talk with your sexual partner about any symptoms that either of you may have, as well as any history of STIs.  Get tested for STIs and STDs (sexually transmitted diseases) before you have sex. Ask your partner to do the same.  Do not have sexual contact if you have symptoms of trichomoniasis or another STI.   Contact a health care provider if:  You still have symptoms after you finish your medicine.  You develop pain in your abdomen.  You have pain when you urinate.  You have bleeding after sex.  You develop a rash.  You feel nauseous or you vomit.  You plan to become pregnant or think you may be pregnant. Summary  Trichomoniasis is an STI (sexually transmitted infection) that can affect both women and men.  This condition often has no symptoms (is asymptomatic), especially in men.  Without treatment, this condition can last for months or years.  You should not have sex until 7-10 days after you finish your medicine, or until your health care provider approves. Ask your health care provider when you may start to have sex again.  Discuss your infection with your sexual partner(s). Make sure that your partner gets tested and treated, if necessary. This information is not intended to replace advice given to you by your health care provider. Make sure you discuss any questions you have with your health care provider. Document Revised: 10/06/2017 Document Reviewed: 10/06/2017 Elsevier Patient Education  2021 [employer].

## 2020-05-02 NOTE — Telephone Encounter (Signed)
Patient is requesting a refill of the following medications: Requested Prescriptions   Pending Prescriptions Disp Refills  . rivaroxaban (XARELTO) 20 MG TABS tablet 30 tablet 2    Sig: TAKE 1 TABLET(20 MG) BY MOUTH DAILY WITH SUPPER    Date of patient request:05/02/2020  Last office visit: 05/02/2020 Date of last refill: 02/10/2020 Last refill amount:30 tablets 2 refills Follow up time period per chart: 07/30/2020

## 2020-05-03 LAB — URINE CYTOLOGY ANCILLARY ONLY
Chlamydia: NEGATIVE
Comment: NEGATIVE
Comment: NEGATIVE
Comment: NORMAL
Neisseria Gonorrhea: NEGATIVE
Trichomonas: NEGATIVE

## 2020-05-03 LAB — HIV ANTIBODY (ROUTINE TESTING W REFLEX): HIV 1&2 Ab, 4th Generation: NONREACTIVE

## 2020-05-03 LAB — RPR: RPR Ser Ql: NONREACTIVE

## 2020-05-09 ENCOUNTER — Other Ambulatory Visit: Payer: Self-pay | Admitting: Registered Nurse

## 2020-05-09 DIAGNOSIS — Z86718 Personal history of other venous thrombosis and embolism: Secondary | ICD-10-CM

## 2020-05-09 DIAGNOSIS — Z7901 Long term (current) use of anticoagulants: Secondary | ICD-10-CM

## 2020-07-30 ENCOUNTER — Other Ambulatory Visit (HOSPITAL_COMMUNITY)
Admission: RE | Admit: 2020-07-30 | Discharge: 2020-07-30 | Disposition: A | Discharge status: Home / Self Care | Payer: Federal, State, Local not specified - PPO | Source: Ambulatory Visit | Attending: Registered Nurse | Admitting: Registered Nurse

## 2020-07-30 ENCOUNTER — Ambulatory Visit: Payer: Federal, State, Local not specified - PPO | Admitting: Registered Nurse

## 2020-07-30 ENCOUNTER — Other Ambulatory Visit: Payer: Self-pay

## 2020-07-30 ENCOUNTER — Encounter: Payer: Self-pay | Admitting: Registered Nurse

## 2020-07-30 VITALS — BP 131/77 | HR 70 | Temp 98.3°F | Resp 18 | Ht 70.5 in | Wt 223.0 lb

## 2020-07-30 DIAGNOSIS — R4582 Worries: Secondary | ICD-10-CM | POA: Insufficient documentation

## 2020-07-30 DIAGNOSIS — Z113 Encounter for screening for infections with a predominantly sexual mode of transmission: Secondary | ICD-10-CM

## 2020-07-30 DIAGNOSIS — R7303 Prediabetes: Secondary | ICD-10-CM

## 2020-07-30 DIAGNOSIS — Z23 Encounter for immunization: Secondary | ICD-10-CM | POA: Diagnosis not present

## 2020-07-30 DIAGNOSIS — Z87448 Personal history of other diseases of urinary system: Secondary | ICD-10-CM | POA: Diagnosis not present

## 2020-07-30 NOTE — Patient Instructions (Addendum)
Ryan Gutierrez -   Ryan Gutierrez to see you. Labs today will be back soon I'll call with any concerns  See you in 6 mo, we can recheck at that time and do your next physical. We can also do your second shingles shot.  Thank you  Ryan Gutierrez     If you have lab work done today you will be contacted with your lab results within the next 2 weeks.  If you have not heard from [place] then please contact us. The fastest way to get your results is to register for My Chart.   IF you received an x-ray today, you will receive an invoice from [facility]. Please contact [facility] at [phone] with questions or concerns regarding your invoice.   IF you received labwork today, you will receive an invoice from [facility]. Please contact [facility] at [phone] with questions or concerns regarding your invoice.   Our billing staff will not be able to assist you with questions regarding bills from these companies.  You will be contacted with the lab results as soon as they are available. The fastest way to get your results is to activate your My Chart account. Instructions are located on the last page of this paperwork. If you have not heard from [place] regarding the results in 2 weeks, please contact this office.

## 2020-07-30 NOTE — Progress Notes (Signed)
Established Patient Office Visit  Subjective:  Patient ID: Ryan Gutierrez, male    DOB: [birth year]  Age: 60 y.o. MRN: [id number]  CC:  Chief Complaint  Patient presents with   Follow-up    Patient states he is here for a follow up.    HPI Harshith Pursell presents for follow up STD screen  Last screen on May 02, 2020 All negative No ongoing concerns.   Hx of hematuria. Recheck today. Noted benign appearing cysts on imaging.   Prediabetes noted on lab work in January. Will recheck today. Diet and exercise have been steady.  Otherwise no concerns  Past Medical History:  Diagnosis Date   Clotting disorder (HCC)    DVT (deep venous thrombosis) (HCC)    post-knee surgery   Hyperlipidemia    Sickle cell trait (HCC)     Past Surgical History:  Procedure Laterality Date   HERNIA REPAIR     KNEE SURGERY  1990s    Family History  Problem Relation Age of Onset   Heart disease Mother    Heart attack Mother    Diabetes Sister    Hypertension Sister    Sickle cell anemia Son     Social History   Socioeconomic History   Marital status: Married    Spouse name: Not on file   Number of children: 2   Years of education: Not on file   Highest education level: Not on file  Occupational History   Not on file  Tobacco Use   Smoking status: Never   Smokeless tobacco: Never  Vaping Use   Vaping Use: Never used  Substance and Sexual Activity   Alcohol use: Yes    Alcohol/week: 0.0 standard drinks    Comment: occ   Drug use: No   Sexual activity: Yes  Other Topics Concern   Not on file  Social History Narrative   Not on file   Social Determinants of Health   Financial Resource Strain: Not on file  Food Insecurity: Not on file  Transportation Needs: Not on file  Physical Activity: Not on file  Stress: Not on file  Social Connections: Not on file  Intimate Partner Violence: Not on file    Outpatient Medications Prior to Visit  Medication Sig Dispense Refill    atorvastatin (LIPITOR) 40 MG tablet TAKE 1 TABLET(40 MG) BY MOUTH DAILY 90 tablet 3   benzonatate (TESSALON PERLES) 100 MG capsule Take 1 capsule (100 mg total) by mouth 3 (three) times daily as needed. 20 capsule 0   XARELTO 20 MG TABS tablet TAKE 1 TABLET(20 MG) BY MOUTH DAILY WITH SUPPER 30 tablet 2   metroNIDAZOLE (FLAGYL) 500 MG tablet Take 1 tablet (500 mg total) by mouth 2 (two) times daily. (Patient not taking: Reported on 07/30/2020) 14 tablet 0   No facility-administered medications prior to visit.    No Known Allergies  ROS Review of Systems  Constitutional: Negative.   HENT: Negative.    Eyes: Negative.   Respiratory: Negative.    Cardiovascular: Negative.   Gastrointestinal: Negative.   Genitourinary: Negative.   Musculoskeletal: Negative.   Skin: Negative.   Neurological: Negative.   Psychiatric/Behavioral: Negative.    All other systems reviewed and are negative.    Objective:    Physical Exam Constitutional:      General: He is not in acute distress.    Appearance: Normal appearance. He is normal weight. He is not ill-appearing, toxic-appearing or diaphoretic.  Cardiovascular:  Rate and Rhythm: Normal rate and regular rhythm.     Heart sounds: Normal heart sounds. No murmur heard.   No friction rub. No gallop.  Pulmonary:     Effort: Pulmonary effort is normal. No respiratory distress.     Breath sounds: Normal breath sounds. No stridor. No wheezing, rhonchi or rales.  Chest:     Chest wall: No tenderness.  Neurological:     General: No focal deficit present.     Mental Status: He is alert and oriented to person, place, and time. Mental status is at baseline.  Psychiatric:        Mood and Affect: Mood normal.        Behavior: Behavior normal.        Thought Content: Thought content normal.        Judgment: Judgment normal.    BP 131/77   Pulse 70   Temp 98.3 F (36.8 C) (Temporal)   Resp 18   Ht 5' 10.5" (1.791 m)   Wt 223 lb (101.2 kg)    SpO2 99%   BMI 31.54 kg/m  Wt Readings from Last 3 Encounters:  07/30/20 223 lb (101.2 kg)  05/02/20 214 lb 6.1 oz (97.2 kg)  03/26/20 216 lb 9.6 oz (98.2 kg)     Health Maintenance Due  Topic Date Due   Zoster Vaccines- Shingrix (1 of 2) Never done    There are no preventive care reminders to display for this patient.  Lab Results  Component Value Date   TSH 2.320 12/14/2018   Lab Results  Component Value Date   WBC 4.9 01/13/2020   HGB 15.5 01/13/2020   HCT 44.6 01/13/2020   MCV 84 01/13/2020   PLT 209 12/14/2018   Lab Results  Component Value Date   NA 141 01/13/2020   K 4.1 01/13/2020   CO2 26 01/13/2020   GLUCOSE 99 01/13/2020   BUN 15 01/13/2020   CREATININE 1.24 01/13/2020   BILITOT 0.8 01/13/2020   ALKPHOS 101 01/13/2020   AST 22 01/13/2020   ALT 29 01/13/2020   PROT 7.5 01/13/2020   ALBUMIN 4.2 01/13/2020   CALCIUM 9.7 01/13/2020   Lab Results  Component Value Date   CHOL 148 01/13/2020   Lab Results  Component Value Date   HDL 46 01/13/2020   Lab Results  Component Value Date   LDLCALC 83 01/13/2020   Lab Results  Component Value Date   TRIG 101 01/13/2020   Lab Results  Component Value Date   CHOLHDL 3.2 01/13/2020   Lab Results  Component Value Date   HGBA1C 6.1 (H) 01/13/2020      Assessment & Plan:   Problem List Items Addressed This Visit   None Visit Diagnoses     History of hematuria    -  Primary   Relevant Orders   Urinalysis   Worries       Relevant Orders   Urine cytology ancillary only([place])   HIV antibody (with reflex)   RPR   Urinalysis   Routine screening for STI (sexually transmitted infection)       Relevant Orders   Urine cytology ancillary only([place])   HIV antibody (with reflex)   RPR   Prediabetes       Relevant Orders   Hemoglobin A1c   Need for shingles vaccine       Relevant Orders   Varicella-zoster vaccine IM (Shingrix)       No orders of the  defined types were placed  in this encounter.   Follow-up: Return in about 6 months (around 01/30/2021) for CPE, labs, shingles shot #2.   PLAN Repeat screenings as above. Will follow up as warranted. Shingles shot #1 given Return in 6 mo for labs, CPE, and shingles shot #2 Janeece Agee, NP

## 2020-07-31 LAB — URINALYSIS, ROUTINE W REFLEX MICROSCOPIC
Bilirubin Urine: NEGATIVE
Hgb urine dipstick: NEGATIVE
Ketones, ur: NEGATIVE
Nitrite: NEGATIVE
RBC / HPF: NONE SEEN (ref 0–?)
Specific Gravity, Urine: 1.02 (ref 1.000–1.030)
Total Protein, Urine: NEGATIVE
Urine Glucose: NEGATIVE
Urobilinogen, UA: 1 (ref 0.0–1.0)
pH: 6 (ref 5.0–8.0)

## 2020-07-31 LAB — HIV ANTIBODY (ROUTINE TESTING W REFLEX): HIV 1&2 Ab, 4th Generation: NONREACTIVE

## 2020-07-31 LAB — RPR: RPR Ser Ql: NONREACTIVE

## 2020-07-31 LAB — HEMOGLOBIN A1C: Hgb A1c MFr Bld: 6.2 % (ref 4.6–6.5)

## 2020-08-01 LAB — URINE CYTOLOGY ANCILLARY ONLY
Chlamydia: NEGATIVE
Comment: NEGATIVE
Comment: NEGATIVE
Comment: NORMAL
Neisseria Gonorrhea: NEGATIVE
Trichomonas: NEGATIVE

## 2021-01-10 ENCOUNTER — Other Ambulatory Visit: Payer: Self-pay | Admitting: Registered Nurse

## 2021-01-10 DIAGNOSIS — E785 Hyperlipidemia, unspecified: Secondary | ICD-10-CM

## 2021-01-30 ENCOUNTER — Encounter: Payer: Self-pay | Admitting: Registered Nurse

## 2021-01-30 ENCOUNTER — Ambulatory Visit: Payer: Federal, State, Local not specified - PPO | Admitting: Registered Nurse

## 2021-01-30 ENCOUNTER — Other Ambulatory Visit: Payer: Self-pay

## 2021-01-30 ENCOUNTER — Ambulatory Visit (INDEPENDENT_AMBULATORY_CARE_PROVIDER_SITE_OTHER): Payer: Federal, State, Local not specified - PPO | Admitting: Registered Nurse

## 2021-01-30 VITALS — BP 123/76 | HR 67 | Temp 98.3°F | Resp 18 | Ht 70.5 in | Wt 227.2 lb

## 2021-01-30 DIAGNOSIS — Z7901 Long term (current) use of anticoagulants: Secondary | ICD-10-CM

## 2021-01-30 DIAGNOSIS — Z13228 Encounter for screening for other metabolic disorders: Secondary | ICD-10-CM | POA: Diagnosis not present

## 2021-01-30 DIAGNOSIS — N529 Male erectile dysfunction, unspecified: Secondary | ICD-10-CM

## 2021-01-30 DIAGNOSIS — R7303 Prediabetes: Secondary | ICD-10-CM | POA: Diagnosis not present

## 2021-01-30 DIAGNOSIS — Z130 Encounter for screening for diseases of the blood and blood-forming organs and certain disorders involving the immune mechanism: Secondary | ICD-10-CM | POA: Diagnosis not present

## 2021-01-30 DIAGNOSIS — Z86718 Personal history of other venous thrombosis and embolism: Secondary | ICD-10-CM

## 2021-01-30 DIAGNOSIS — Z23 Encounter for immunization: Secondary | ICD-10-CM

## 2021-01-30 DIAGNOSIS — E785 Hyperlipidemia, unspecified: Secondary | ICD-10-CM

## 2021-01-30 DIAGNOSIS — Z0000 Encounter for general adult medical examination without abnormal findings: Secondary | ICD-10-CM

## 2021-01-30 DIAGNOSIS — Z1329 Encounter for screening for other suspected endocrine disorder: Secondary | ICD-10-CM | POA: Diagnosis not present

## 2021-01-30 LAB — COMPREHENSIVE METABOLIC PANEL
ALT: 26 U/L (ref 0–53)
AST: 24 U/L (ref 0–37)
Albumin: 4.1 g/dL (ref 3.5–5.2)
Alkaline Phosphatase: 89 U/L (ref 39–117)
BUN: 13 mg/dL (ref 6–23)
CO2: 31 mEq/L (ref 19–32)
Calcium: 9.4 mg/dL (ref 8.4–10.5)
Chloride: 103 mEq/L (ref 96–112)
Creatinine, Ser: 1.1 mg/dL (ref 0.40–1.50)
GFR: 73.9 mL/min (ref >60.00)
Glucose, Bld: 93 mg/dL (ref 70–99)
Potassium: 4.2 mEq/L (ref 3.5–5.1)
Sodium: 139 mEq/L (ref 135–145)
Total Bilirubin: 0.9 mg/dL (ref 0.2–1.2)
Total Protein: 7.6 g/dL (ref 6.0–8.3)

## 2021-01-30 LAB — CBC WITH DIFFERENTIAL/PLATELET
Basophils Absolute: 0 10*3/uL (ref 0.0–0.1)
Basophils Relative: 0.4 % (ref 0.0–3.0)
Eosinophils Absolute: 0.1 10*3/uL (ref 0.0–0.7)
Eosinophils Relative: 2.8 % (ref 0.0–5.0)
HCT: 46.1 % (ref 39.0–52.0)
Hemoglobin: 15.2 g/dL (ref 13.0–17.0)
Lymphocytes Relative: 21.6 % (ref 12.0–46.0)
Lymphs Abs: 1 10*3/uL (ref 0.7–4.0)
MCHC: 32.9 g/dL (ref 30.0–36.0)
MCV: 89.3 fl (ref 78.0–100.0)
Monocytes Absolute: 0.4 10*3/uL (ref 0.1–1.0)
Monocytes Relative: 8.1 % (ref 3.0–12.0)
Neutro Abs: 3.2 10*3/uL (ref 1.4–7.7)
Neutrophils Relative %: 67.1 % (ref 43.0–77.0)
Platelets: 144 10*3/uL — ABNORMAL LOW (ref 150.0–400.0)
RBC: 5.16 Mil/uL (ref 4.22–5.81)
RDW: 14.8 % (ref 11.5–15.5)
WBC: 4.8 10*3/uL (ref 4.0–10.5)

## 2021-01-30 LAB — LIPID PANEL
Cholesterol: 143 mg/dL (ref 0–200)
HDL: 43.1 mg/dL (ref >39.00)
LDL Cholesterol: 78 mg/dL (ref 0–99)
NonHDL: 99.91
Total CHOL/HDL Ratio: 3
Triglycerides: 109 mg/dL (ref 0.0–149.0)
VLDL: 21.8 mg/dL (ref 0.0–40.0)

## 2021-01-30 LAB — TSH: TSH: 2.65 u[IU]/mL (ref 0.35–5.50)

## 2021-01-30 LAB — HEMOGLOBIN A1C: Hgb A1c MFr Bld: 6.2 % (ref 4.6–6.5)

## 2021-01-30 MED ORDER — RIVAROXABAN 20 MG PO TABS: ORAL_TABLET | ORAL | 2 refills | Status: DC

## 2021-01-30 MED ORDER — ATORVASTATIN CALCIUM 40 MG PO TABS: ORAL_TABLET | ORAL | 3 refills | Status: DC

## 2021-01-30 MED ORDER — SILDENAFIL CITRATE 50 MG PO TABS: 25.0000 mg | ORAL_TABLET | Freq: Every day | ORAL | 3 refills | Status: DC | PRN

## 2021-01-30 NOTE — Patient Instructions (Signed)
Mr. Wangerin -  Sorry about the lab tech being out. Come on back in here or heat to [address] in [place] to have labs taken. The [address] site is across from [facility], they take walk ins.  I have sent sildenafil. Start with 1/2 tablet daily. If it doesn't work, increase by 1/2 a tablet daily to a max of 2 tablets. Call if not effective at that dose.  See you in a year for a physical and labs  Call sooner if you need anything!  Thanks,  Luan Pulling

## 2021-01-30 NOTE — Progress Notes (Signed)
Established Patient Office Visit  Subjective:  Patient ID: Ryan Gutierrez, male    DOB: 1960-04-14  Age: 61 y.o. MRN: [id number]  CC:  Chief Complaint  Patient presents with   Annual Exam    Patient states he is here for CPE and shingles shot .    HPI Ryan Gutierrez presents for CPE  ED New problem No changes to diet, lifestyle, activity Has not used agents in the past.  No new meds.  Hernia Ongoing Notes blood in stool once every few weeks No new pains or bowel habits No weight loss  Would like to have consult for repair  Long term anticoag Hx of dvt On xarelto daily No AE. Tolerates well.  Due for shingles shhot #2 No AE with first dose.   Histories reviewed and updated with patient.    Past Medical History:  Diagnosis Date   Clotting disorder ([place])    DVT (deep venous thrombosis) (HCC)    post-knee surgery   Hyperlipidemia    Sickle cell trait (HCC)     Past Surgical History:  Procedure Laterality Date   HERNIA REPAIR     KNEE SURGERY  1990s    Family History  Problem Relation Age of Onset   Heart disease Mother    Heart attack Mother    Diabetes Sister    Hypertension Sister    Sickle cell anemia Son     Social History   Socioeconomic History   Marital status: Married    Spouse name: Not on file   Number of children: 2   Years of education: Not on file   Highest education level: Not on file  Occupational History   Not on file  Tobacco Use   Smoking status: Never   Smokeless tobacco: Never  Vaping Use   Vaping Use: Never used  Substance and Sexual Activity   Alcohol use: Yes    Alcohol/week: 0.0 standard drinks    Comment: occ   Drug use: No   Sexual activity: Yes  Other Topics Concern   Not on file  Social History Narrative   Not on file   Social Determinants of Health   Financial Resource Strain: Not on file  Food Insecurity: Not on file  Transportation Needs: Not on file  Physical Activity: Not on file  Stress: Not  on file  Social Connections: Not on file  Intimate Partner Violence: Not on file    Outpatient Medications Prior to Visit  Medication Sig Dispense Refill   atorvastatin (LIPITOR) 40 MG tablet TAKE 1 TABLET(40 MG) BY MOUTH DAILY 90 tablet 3   benzonatate (TESSALON PERLES) 100 MG capsule Take 1 capsule (100 mg total) by mouth 3 (three) times daily as needed. 20 capsule 0   XARELTO 20 MG TABS tablet TAKE 1 TABLET(20 MG) BY MOUTH DAILY WITH SUPPER 30 tablet 2   metroNIDAZOLE (FLAGYL) 500 MG tablet Take 1 tablet (500 mg total) by mouth 2 (two) times daily. (Patient not taking: Reported on 07/30/2020) 14 tablet 0   No facility-administered medications prior to visit.    No Known Allergies  ROS Review of Systems  Constitutional: Negative.   HENT: Negative.    Eyes: Negative.   Respiratory: Negative.    Cardiovascular: Negative.   Gastrointestinal: Negative.   Genitourinary: Negative.   Musculoskeletal: Negative.   Skin: Negative.   Neurological: Negative.   Psychiatric/Behavioral: Negative.    All other systems reviewed and are negative.    Objective:  Physical Exam Vitals and nursing note reviewed.  Constitutional:      General: He is not in acute distress.    Appearance: Normal appearance. He is normal weight. He is not ill-appearing, toxic-appearing or diaphoretic.  HENT:     Head: Normocephalic and atraumatic.     Right Ear: Tympanic membrane, ear canal and external ear normal. There is no impacted cerumen.     Left Ear: Tympanic membrane, ear canal and external ear normal. There is no impacted cerumen.     Nose: Nose normal. No congestion or rhinorrhea.     Mouth/Throat:     Mouth: Mucous membranes are moist.     Pharynx: Oropharynx is clear. No oropharyngeal exudate or posterior oropharyngeal erythema.  Eyes:     General: No scleral icterus.       Right eye: No discharge.        Left eye: No discharge.     Extraocular Movements: Extraocular movements intact.      Conjunctiva/sclera: Conjunctivae normal.     Pupils: Pupils are equal, round, and reactive to light.  Neck:     Vascular: No carotid bruit.  Cardiovascular:     Rate and Rhythm: Normal rate and regular rhythm.     Pulses: Normal pulses.     Heart sounds: Normal heart sounds. No murmur heard.   No friction rub. No gallop.  Pulmonary:     Effort: Pulmonary effort is normal. No respiratory distress.     Breath sounds: Normal breath sounds. No stridor. No wheezing, rhonchi or rales.  Chest:     Chest wall: No tenderness.  Abdominal:     General: Abdomen is flat. Bowel sounds are normal. There is no distension.     Palpations: Abdomen is soft. There is no mass.     Tenderness: There is no abdominal tenderness. There is no right CVA tenderness, left CVA tenderness, guarding or rebound.     Hernia: No hernia is present.  Musculoskeletal:        General: No swelling, tenderness, deformity or signs of injury. Normal range of motion.     Cervical back: Normal range of motion and neck supple. No rigidity or tenderness.     Right lower leg: No edema.     Left lower leg: No edema.  Lymphadenopathy:     Cervical: No cervical adenopathy.  Skin:    General: Skin is warm and dry.     Capillary Refill: Capillary refill takes less than 2 seconds.     Coloration: Skin is not jaundiced or pale.     Findings: No bruising, erythema, lesion or rash.  Neurological:     General: No focal deficit present.     Mental Status: He is alert and oriented to person, place, and time. Mental status is at baseline.     Cranial Nerves: No cranial nerve deficit.     Motor: No weakness.     Gait: Gait normal.  Psychiatric:        Mood and Affect: Mood normal.        Behavior: Behavior normal.        Thought Content: Thought content normal.        Judgment: Judgment normal.    Temp 98.3 F (36.8 C) (Temporal)    Resp 18    Ht 5' 10.5" (1.791 m)    Wt 227 lb 3.2 oz (103.1 kg)    BMI 32.14 kg/m  Wt Readings from  Last 3 Encounters:  01/30/21 227 lb 3.2 oz (103.1 kg)  07/30/20 223 lb (101.2 kg)  05/02/20 214 lb 6.1 oz (97.2 kg)     Health Maintenance Due  Topic Date Due   Zoster Vaccines- Shingrix (2 of 2) 09/24/2020    There are no preventive care reminders to display for this patient.  Lab Results  Component Value Date   TSH 2.320 12/14/2018   Lab Results  Component Value Date   WBC 4.9 01/13/2020   HGB 15.5 01/13/2020   HCT 44.6 01/13/2020   MCV 84 01/13/2020   PLT 209 12/14/2018   Lab Results  Component Value Date   NA 141 01/13/2020   K 4.1 01/13/2020   CO2 26 01/13/2020   GLUCOSE 99 01/13/2020   BUN 15 01/13/2020   CREATININE 1.24 01/13/2020   BILITOT 0.8 01/13/2020   ALKPHOS 101 01/13/2020   AST 22 01/13/2020   ALT 29 01/13/2020   PROT 7.5 01/13/2020   ALBUMIN 4.2 01/13/2020   CALCIUM 9.7 01/13/2020   Lab Results  Component Value Date   CHOL 148 01/13/2020   Lab Results  Component Value Date   HDL 46 01/13/2020   Lab Results  Component Value Date   LDLCALC 83 01/13/2020   Lab Results  Component Value Date   TRIG 101 01/13/2020   Lab Results  Component Value Date   CHOLHDL 3.2 01/13/2020   Lab Results  Component Value Date   HGBA1C 6.2 07/30/2020      Assessment & Plan:   Problem List Items Addressed This Visit   None   No orders of the defined types were placed in this encounter.   Follow-up: No follow-ups on file.   PLAN Refill atorvastatin x 1 year. Refill xarelto x 1 year. Start sildenafil 25-100mg  po qd prn. Discussed risks, benefits, and alternatives. Pt voices understanding. Future labs placed. Pt to proceed to lab at [facility] to have collected. Return annually for CPE Patient encouraged to call clinic with any questions, comments, or concerns.  Maximiano Coss, NP

## 2021-02-23 NOTE — Progress Notes (Signed)
Established Patient Office Visit  Subjective:  Patient ID: Ryan Gutierrez, male    DOB: [birth year]  Age: 61 y.o. MRN: [id number]  CC:  Chief Complaint  Patient presents with   Medical Management of Chronic Issues    6 m f/u    HPI Ryan Gutierrez presents for 6 mo follow up   No acute concerns  Bp stable Hx of dvt, prediabetes  Needs to recheck.  Hld Takes atorvastatin 20mg  po qd Lab Results  Component Value Date   CHOL 143 01/30/2021   HDL 43.10 01/30/2021   LDLCALC 78 01/30/2021   TRIG 109.0 01/30/2021   CHOLHDL 3 01/30/2021     Past Medical History:  Diagnosis Date   Clotting disorder (HCC)    DVT (deep venous thrombosis) (HCC)    post-knee surgery   Hyperlipidemia    Sickle cell trait (HCC)     Past Surgical History:  Procedure Laterality Date   HERNIA REPAIR     KNEE SURGERY  1990s    Family History  Problem Relation Age of Onset   Heart disease Mother    Heart attack Mother    Diabetes Sister    Hypertension Sister    Sickle cell anemia Son     Social History   Socioeconomic History   Marital status: Married    Spouse name: Not on file   Number of children: 2   Years of education: Not on file   Highest education level: Not on file  Occupational History   Not on file  Tobacco Use   Smoking status: Never   Smokeless tobacco: Never  Vaping Use   Vaping Use: Never used  Substance and Sexual Activity   Alcohol use: Yes    Alcohol/week: 0.0 standard drinks    Comment: occ   Drug use: No   Sexual activity: Yes  Other Topics Concern   Not on file  Social History Narrative   Not on file   Social Determinants of Health   Financial Resource Strain: Not on file  Food Insecurity: Not on file  Transportation Needs: Not on file  Physical Activity: Not on file  Stress: Not on file  Social Connections: Not on file  Intimate Partner Violence: Not on file    Outpatient Medications Prior to Visit  Medication Sig Dispense Refill    atorvastatin (LIPITOR) 40 MG tablet TAKE 1 TABLET(40 MG) BY MOUTH DAILY 30 tablet 0   XARELTO 20 MG TABS tablet TAKE 1 TABLET(20 MG) BY MOUTH DAILY WITH SUPPER 30 tablet 0   azelastine (ASTELIN) 0.1 % nasal spray Place 1 spray into both nostrils 2 (two) times daily. Use in each nostril as directed (Patient not taking: Reported on 01/13/2020) 30 mL 12   cetirizine (ZYRTEC) 10 MG tablet Take 1 tablet (10 mg total) by mouth daily. (Patient not taking: Reported on 01/13/2020) 30 tablet 11   fluticasone (FLONASE) 50 MCG/ACT nasal spray Place 2 sprays into both nostrils daily. (Patient not taking: Reported on 01/13/2020) 16 g 6   montelukast (SINGULAIR) 10 MG tablet Take 1 tablet (10 mg total) by mouth at bedtime. (Patient not taking: Reported on 01/13/2020) 30 tablet 3   No facility-administered medications prior to visit.    No Known Allergies  ROS Review of Systems  Constitutional: Negative.   HENT: Negative.    Eyes: Negative.   Respiratory: Negative.    Cardiovascular: Negative.   Gastrointestinal: Negative.   Genitourinary: Negative.   Musculoskeletal: Negative.   Skin:  Negative.   Neurological: Negative.   Psychiatric/Behavioral: Negative.    All other systems reviewed and are negative.    Objective:    Physical Exam Constitutional:      General: He is not in acute distress.    Appearance: Normal appearance. He is normal weight. He is not ill-appearing, toxic-appearing or diaphoretic.  Cardiovascular:     Rate and Rhythm: Normal rate and regular rhythm.     Heart sounds: Normal heart sounds. No murmur heard.   No friction rub. No gallop.  Pulmonary:     Effort: Pulmonary effort is normal. No respiratory distress.     Breath sounds: Normal breath sounds. No stridor. No wheezing, rhonchi or rales.  Chest:     Chest wall: No tenderness.  Neurological:     General: No focal deficit present.     Mental Status: He is alert and oriented to person, place, and time. Mental status is at  baseline.  Psychiatric:        Mood and Affect: Mood normal.        Behavior: Behavior normal.        Thought Content: Thought content normal.        Judgment: Judgment normal.    BP 128/62    Pulse 69    Temp 97.7 F (36.5 C) (Temporal)    Ht 5' 10.5" (1.791 m)    Wt 220 lb (99.8 kg)    SpO2 97%    BMI 31.12 kg/m  Wt Readings from Last 3 Encounters:  01/30/21 227 lb 3.2 oz (103.1 kg)  07/30/20 223 lb (101.2 kg)  05/02/20 214 lb 6.1 oz (97.2 kg)     Health Maintenance Due  Topic Date Due   COVID-19 Vaccine (1) Never done    There are no preventive care reminders to display for this patient.  Lab Results  Component Value Date   TSH 2.65 01/30/2021   Lab Results  Component Value Date   WBC 4.8 01/30/2021   HGB 15.2 01/30/2021   HCT 46.1 01/30/2021   MCV 89.3 01/30/2021   PLT 144.0 (L) 01/30/2021   Lab Results  Component Value Date   NA 139 01/30/2021   K 4.2 01/30/2021   CO2 31 01/30/2021   GLUCOSE 93 01/30/2021   BUN 13 01/30/2021   CREATININE 1.10 01/30/2021   BILITOT 0.9 01/30/2021   ALKPHOS 89 01/30/2021   AST 24 01/30/2021   ALT 26 01/30/2021   PROT 7.6 01/30/2021   ALBUMIN 4.1 01/30/2021   CALCIUM 9.4 01/30/2021   GFR 73.90 01/30/2021   Lab Results  Component Value Date   CHOL 143 01/30/2021   Lab Results  Component Value Date   HDL 43.10 01/30/2021   Lab Results  Component Value Date   LDLCALC 78 01/30/2021   Lab Results  Component Value Date   TRIG 109.0 01/30/2021   Lab Results  Component Value Date   CHOLHDL 3 01/30/2021   Lab Results  Component Value Date   HGBA1C 6.2 01/30/2021      Assessment & Plan:   Problem List Items Addressed This Visit       Other   Long term (current) use of anticoagulants - Primary   Relevant Orders   CBC With Differential (Completed)   Comprehensive metabolic panel (Completed)   Other Visit Diagnoses     History of DVT (deep vein thrombosis)       Relevant Orders   CBC With Differential  (Completed)  Comprehensive metabolic panel (Completed)   Hemoglobin A1c (Completed)   Hyperlipidemia, unspecified hyperlipidemia type       Relevant Orders   Hemoglobin A1c (Completed)   Lipid panel (Completed)       No orders of the defined types were placed in this encounter.   Follow-up: No follow-ups on file.   PLAN Labs collected. Will follow up with the patient as warranted. Continue current meds Return in 6 mo, sooner if labs indicate Patient encouraged to call clinic with any questions, comments, or concerns.  Janeece Agee, NP

## 2021-05-17 ENCOUNTER — Other Ambulatory Visit: Payer: Self-pay

## 2021-05-17 DIAGNOSIS — Z86718 Personal history of other venous thrombosis and embolism: Secondary | ICD-10-CM

## 2021-05-17 DIAGNOSIS — Z7901 Long term (current) use of anticoagulants: Secondary | ICD-10-CM

## 2021-05-17 MED ORDER — RIVAROXABAN 20 MG PO TABS: ORAL_TABLET | ORAL | 2 refills | Status: DC

## 2021-05-17 NOTE — Telephone Encounter (Signed)
Patient is requesting a refill of the following medications: ?Requested Prescriptions  ? ?Pending Prescriptions Disp Refills  ? rivaroxaban (XARELTO) 20 MG TABS tablet 30 tablet 2  ?  Sig: TAKE 1 TABLET(20 MG) BY MOUTH DAILY WITH SUPPER  ? ? ?Date of patient request: 05/17/21 ?Last office visit: 01/30/21 ?Date of last refill: 01/30/21 ?Last refill amount: 30 ? ?

## 2021-05-23 ENCOUNTER — Other Ambulatory Visit: Payer: Self-pay | Admitting: Registered Nurse

## 2021-05-23 DIAGNOSIS — Z86718 Personal history of other venous thrombosis and embolism: Secondary | ICD-10-CM

## 2021-05-23 DIAGNOSIS — Z7901 Long term (current) use of anticoagulants: Secondary | ICD-10-CM

## 2021-06-06 ENCOUNTER — Ambulatory Visit: Payer: Federal, State, Local not specified - PPO | Admitting: Registered Nurse

## 2021-06-06 ENCOUNTER — Other Ambulatory Visit: Payer: Self-pay

## 2021-06-06 ENCOUNTER — Encounter: Payer: Self-pay | Admitting: Registered Nurse

## 2021-06-06 VITALS — BP 128/76 | HR 80 | Temp 98.0°F | Resp 18 | Ht 70.5 in | Wt 218.8 lb

## 2021-06-06 DIAGNOSIS — R058 Other specified cough: Secondary | ICD-10-CM | POA: Diagnosis not present

## 2021-06-06 DIAGNOSIS — R051 Acute cough: Secondary | ICD-10-CM

## 2021-06-06 MED ORDER — PREDNISONE 10 MG (21) PO TBPK: ORAL_TABLET | ORAL | 0 refills | Status: DC

## 2021-06-06 MED ORDER — AMOXICILLIN-POT CLAVULANATE 875-125 MG PO TABS: 1.0000 | ORAL_TABLET | Freq: Two times a day (BID) | ORAL | 0 refills | Status: DC

## 2021-06-06 MED ORDER — PANTOPRAZOLE SODIUM 40 MG PO TBEC: 40.0000 mg | DELAYED_RELEASE_TABLET | Freq: Every day | ORAL | 3 refills | Status: DC

## 2021-06-06 NOTE — Patient Instructions (Addendum)
Mr. Yerger -   Randie Heinz to see you  Call with concerns  Let me know if you're not any better by Tuesday  Thank you,  Rich     If you have lab work done today you will be contacted with your lab results within the next 2 weeks.  If you have not heard from [place] then please contact us. The fastest way to get your results is to register for My Chart.   IF you received an x-ray today, you will receive an invoice from [facility]. Please contact [facility] at [phone] with questions or concerns regarding your invoice.   IF you received labwork today, you will receive an invoice from [facility]. Please contact [facility] at [phone] with questions or concerns regarding your invoice.   Our billing staff will not be able to assist you with questions regarding bills from these companies.  You will be contacted with the lab results as soon as they are available. The fastest way to get your results is to activate your My Chart account. Instructions are located on the last page of this paperwork. If you have not heard from [place] regarding the results in 2 weeks, please contact this office.

## 2021-06-06 NOTE — Progress Notes (Signed)
Acute Office Visit  Subjective:    Patient ID: Edahi Kroening, male    DOB: [birth year], 61 y.o.   MRN: [id number]  Chief Complaint  Patient presents with   Cough    Patient states he has had a cough , congestion and runny nose.    HPI Patient is in today for cough  Onset 2 weeks ago. Dry cough, congestion, runny nose. Some L side rib pain.  No pressure in ears. Some sinus pressure.  No shob, doe, chest pain.  Did have one episode of diarrhea, no nv  Has been taking mucinex, some relief.   Outpatient Medications Prior to Visit  Medication Sig Dispense Refill   atorvastatin (LIPITOR) 40 MG tablet TAKE 1 TABLET(40 MG) BY MOUTH DAILY 90 tablet 3   benzonatate (TESSALON PERLES) 100 MG capsule Take 1 capsule (100 mg total) by mouth 3 (three) times daily as needed. 20 capsule 0   rivaroxaban (XARELTO) 20 MG TABS tablet TAKE 1 TABLET(20 MG) BY MOUTH DAILY WITH SUPPER 30 tablet 2   sildenafil (VIAGRA) 50 MG tablet Take 0.5-2 tablets (25-100 mg total) by mouth daily as needed for erectile dysfunction. 30 tablet 3   No facility-administered medications prior to visit.    Review of Systems  Constitutional: Negative.   HENT: Negative.    Eyes: Negative.   Respiratory: Negative.    Cardiovascular: Negative.   Gastrointestinal: Negative.   Genitourinary: Negative.   Musculoskeletal: Negative.   Skin: Negative.   Neurological: Negative.   Psychiatric/Behavioral: Negative.    All other systems reviewed and are negative.     Objective:    BP 128/76   Pulse 80   Temp 98 F (36.7 C) (Temporal)   Resp 18   Ht 5' 10.5" (1.791 m)   Wt 218 lb 12.8 oz (99.2 kg)   BMI 30.95 kg/m  Physical Exam Constitutional:      General: He is not in acute distress.    Appearance: Normal appearance. He is normal weight. He is not ill-appearing, toxic-appearing or diaphoretic.  Cardiovascular:     Rate and Rhythm: Normal rate and regular rhythm.     Heart sounds: Normal heart sounds. No  murmur heard.   No friction rub. No gallop.  Pulmonary:     Effort: Pulmonary effort is normal. No respiratory distress.     Breath sounds: Normal breath sounds. No stridor. No wheezing, rhonchi or rales.  Chest:     Chest wall: No tenderness.  Neurological:     General: No focal deficit present.     Mental Status: He is alert and oriented to person, place, and time. Mental status is at baseline.  Psychiatric:        Mood and Affect: Mood normal.        Behavior: Behavior normal.        Thought Content: Thought content normal.        Judgment: Judgment normal.    No results found for any visits on 06/06/21.      Assessment & Plan:  1. Dry cough - pantoprazole (PROTONIX) 40 MG tablet; Take 1 tablet (40 mg total) by mouth daily.  Dispense: 90 tablet; Refill: 3  2. Acute cough - amoxicillin-clavulanate (AUGMENTIN) 875-125 MG tablet; Take 1 tablet by mouth 2 (two) times daily.  Dispense: 20 tablet; Refill: 0 - predniSONE (STERAPRED UNI-PAK 21 TAB) 10 MG (21) TBPK tablet; Take per package instructions. Do not skip doses. Finish entire supply.  Dispense: 1 each; Refill:  0    Meds ordered this encounter  Medications   pantoprazole (PROTONIX) 40 MG tablet    Sig: Take 1 tablet (40 mg total) by mouth daily.    Dispense:  90 tablet    Refill:  3    Order Specific Question:   Supervising Provider    Answer:   Neva Seat, JEFFREY R [[id number]]   amoxicillin-clavulanate (AUGMENTIN) 875-125 MG tablet    Sig: Take 1 tablet by mouth 2 (two) times daily.    Dispense:  20 tablet    Refill:  0    Order Specific Question:   Supervising Provider    Answer:   Neva Seat, JEFFREY R [[id number]]   predniSONE (STERAPRED UNI-PAK 21 TAB) 10 MG (21) TBPK tablet    Sig: Take per package instructions. Do not skip doses. Finish entire supply.    Dispense:  1 each    Refill:  0    Order Specific Question:   Supervising Provider    Answer:   Neva Seat, JEFFREY R [[id number]]    Return if symptoms worsen or fail to  improve.  PLAN Exam unremarkable GERD vs acute infection. Treat with each as above Return if worsening or failing to improve Patient encouraged to call clinic with any questions, comments, or concerns.   Janeece Agee, NP

## 2021-08-18 ENCOUNTER — Other Ambulatory Visit: Payer: Self-pay | Admitting: Registered Nurse

## 2021-08-18 DIAGNOSIS — Z86718 Personal history of other venous thrombosis and embolism: Secondary | ICD-10-CM

## 2021-08-18 DIAGNOSIS — Z7901 Long term (current) use of anticoagulants: Secondary | ICD-10-CM

## 2021-08-19 NOTE — Telephone Encounter (Signed)
Patient is requesting a refill of the following medications: Requested Prescriptions   Pending Prescriptions Disp Refills   XARELTO 20 MG TABS tablet [Pharmacy Med Name: XARELTO 20MG  TABLETS] 30 tablet 2    Sig: TAKE 1 TABLET(20 MG) BY MOUTH DAILY WITH SUPPER    Date of patient request: 06/18/2021 Last office visit: 06/06/2021 Date of last refill: 05/17/2021 Last refill amount: 30 tablets 2 refills   Follow up time period per chart: was scheduled for 02/05/2022

## 2021-08-20 ENCOUNTER — Telehealth: Payer: Self-pay | Admitting: Registered Nurse

## 2021-08-20 ENCOUNTER — Other Ambulatory Visit: Payer: Self-pay

## 2021-08-20 DIAGNOSIS — Z7901 Long term (current) use of anticoagulants: Secondary | ICD-10-CM

## 2021-08-20 DIAGNOSIS — Z86718 Personal history of other venous thrombosis and embolism: Secondary | ICD-10-CM

## 2021-08-20 MED ORDER — RIVAROXABAN 20 MG PO TABS: ORAL_TABLET | ORAL | 6 refills | Status: DC

## 2021-08-20 NOTE — Telephone Encounter (Signed)
Encourage patient to contact the pharmacy for refills or they can request refills through [facility]  (Please schedule appointment if patient has not been seen in over a year)    WHAT PHARMACY WOULD THEY LIKE THIS SENT TO: [facility] [phone]  MEDICATION NAME & DOSE: xarelto 20 mg  NOTES/COMMENTS FROM PATIENT:      Front office please notify patient: It takes 48-72 hours to process rx refill requests Ask patient to call pharmacy to ensure rx is ready before heading there.

## 2021-08-20 NOTE — Telephone Encounter (Signed)
Refill has been sent to pharmacy.  

## 2021-11-06 ENCOUNTER — Encounter: Payer: Self-pay | Admitting: Nurse Practitioner

## 2021-11-06 ENCOUNTER — Encounter: Payer: Self-pay | Admitting: Emergency Medicine

## 2021-11-06 ENCOUNTER — Ambulatory Visit: Payer: Federal, State, Local not specified - PPO | Admitting: Emergency Medicine

## 2021-11-06 VITALS — BP 136/80 | HR 78 | Temp 97.7°F | Ht 70.5 in | Wt 224.4 lb

## 2021-11-06 DIAGNOSIS — Z7901 Long term (current) use of anticoagulants: Secondary | ICD-10-CM

## 2021-11-06 DIAGNOSIS — I82409 Acute embolism and thrombosis of unspecified deep veins of unspecified lower extremity: Secondary | ICD-10-CM | POA: Diagnosis not present

## 2021-11-06 DIAGNOSIS — Z23 Encounter for immunization: Secondary | ICD-10-CM

## 2021-11-06 DIAGNOSIS — E785 Hyperlipidemia, unspecified: Secondary | ICD-10-CM

## 2021-11-06 DIAGNOSIS — Z7689 Persons encountering health services in other specified circumstances: Secondary | ICD-10-CM | POA: Diagnosis not present

## 2021-11-06 DIAGNOSIS — Z1211 Encounter for screening for malignant neoplasm of colon: Secondary | ICD-10-CM

## 2021-11-06 DIAGNOSIS — K921 Melena: Secondary | ICD-10-CM

## 2021-11-06 NOTE — Assessment & Plan Note (Addendum)
Stable.  On Xarelto 20 mg daily for lifetime.

## 2021-11-06 NOTE — Progress Notes (Signed)
Ryan Gutierrez 61 y.o.   Chief Complaint  Patient presents with   transfer of care    Left sided soreness and discomfort/ gluteal muscle     HISTORY OF PRESENT ILLNESS: This is a 61 y.o. male first visit to this office, here to establish care with me. Former patient of Maximiano Coss. Has history of recurrent DVTs on long-term anticoagulation for lifetime History of dyslipidemia None smoker.  No other chronic medical problems. Has occasional pains to left buttock area.  Asymptomatic today.  HPI   Prior to Admission medications   Medication Sig Start Date End Date Taking? Authorizing Provider  amoxicillin-clavulanate (AUGMENTIN) 875-125 MG tablet Take 1 tablet by mouth 2 (two) times daily. 06/06/21   Maximiano Coss, NP  atorvastatin (LIPITOR) 40 MG tablet TAKE 1 TABLET(40 MG) BY MOUTH DAILY 01/30/21   Maximiano Coss, NP  benzonatate (TESSALON PERLES) 100 MG capsule Take 1 capsule (100 mg total) by mouth 3 (three) times daily as needed. 04/12/20   Lucretia Kern, DO  pantoprazole (PROTONIX) 40 MG tablet Take 1 tablet (40 mg total) by mouth daily. 06/06/21   Maximiano Coss, NP  predniSONE (STERAPRED UNI-PAK 21 TAB) 10 MG (21) TBPK tablet Take per package instructions. Do not skip doses. Finish entire supply. 06/06/21   Maximiano Coss, NP  rivaroxaban (XARELTO) 20 MG TABS tablet TAKE 1 TABLET(20 MG) BY MOUTH DAILY WITH SUPPER 08/20/21   Wendie Agreste, MD  sildenafil (VIAGRA) 50 MG tablet Take 0.5-2 tablets (25-100 mg total) by mouth daily as needed for erectile dysfunction. 01/30/21   Maximiano Coss, NP    No Known Allergies  Patient Active Problem List   Diagnosis Date Noted   Dyslipidemia 11/06/2021   Mixed hyperlipidemia 05/26/2013   Other pulmonary embolism and infarction 05/26/2013   Long term (current) use of anticoagulants 05/26/2013   Hematochezia 11/05/2011   Recurrent deep vein thrombosis ([place]) 10/24/2011    Past Medical History:  Diagnosis Date   Clotting disorder  ([place])    DVT (deep venous thrombosis) (HCC)    post-knee surgery   Hyperlipidemia    Sickle cell trait (HCC)     Past Surgical History:  Procedure Laterality Date   HERNIA REPAIR     KNEE SURGERY  1990s    Social History   Socioeconomic History   Marital status: Married    Spouse name: Not on file   Number of children: 2   Years of education: Not on file   Highest education level: Not on file  Occupational History   Not on file  Tobacco Use   Smoking status: Never   Smokeless tobacco: Never  Vaping Use   Vaping Use: Never used  Substance and Sexual Activity   Alcohol use: Yes    Alcohol/week: 0.0 standard drinks of alcohol    Comment: occ   Drug use: No   Sexual activity: Yes  Other Topics Concern   Not on file  Social History Narrative   Not on file   Social Determinants of Health   Financial Resource Strain: Low Risk  (12/14/2018)   Overall Financial Resource Strain (CARDIA)    Difficulty of Paying Living Expenses: Not hard at all  Food Insecurity: No Food Insecurity (12/14/2018)   Hunger Vital Sign    Worried About Running Out of Food in the Last Year: Never true    Ran Out of Food in the Last Year: Never true  Transportation Needs: No Transportation Needs (12/14/2018)   PRAPARE - Transportation  Lack of Transportation (Medical): No    Lack of Transportation (Non-Medical): No  Physical Activity: Insufficiently Active (12/14/2018)   Exercise Vital Sign    Days of Exercise per Week: 3 days    Minutes of Exercise per Session: 30 min  Stress: No Stress Concern Present (12/14/2018)   [employer] - Occupational Stress Questionnaire    Feeling of Stress : Only a little  Social Connections: Unknown (12/14/2018)   Social Connection and Isolation Panel [NHANES]    Frequency of Communication with Friends and Family: Three times a week    Frequency of Social Gatherings with Friends and Family: Twice a week    Attends Religious Services:  Patient refused    Active Member of Clubs or Organizations: Patient refused    Attends BankerClub or Organization Meetings: Patient refused    Marital Status: Patient refused  Intimate Partner Violence: Not At Risk (12/14/2018)   Humiliation, Afraid, Rape, and Kick questionnaire    Fear of Current or Ex-Partner: No    Emotionally Abused: No    Physically Abused: No    Sexually Abused: No    Family History  Problem Relation Age of Onset   Heart disease Mother    Heart attack Mother    Diabetes Sister    Hypertension Sister    Sickle cell anemia Son      Review of Systems  Constitutional: Negative.  Negative for chills and fever.  HENT: Negative.  Negative for congestion and sore throat.   Respiratory: Negative.  Negative for cough and shortness of breath.   Cardiovascular: Negative.  Negative for chest pain and palpitations.  Gastrointestinal:  Positive for blood in stool (Occasional blood in the stools). Negative for abdominal pain, diarrhea, nausea and vomiting.  Genitourinary: Negative.  Negative for dysuria and hematuria.  Musculoskeletal:  Positive for back pain.  Skin: Negative.  Negative for rash.  Neurological: Negative.  Negative for dizziness and headaches.  All other systems reviewed and are negative.  Today's Vitals   11/06/21 1304  BP: 136/80  Pulse: 78  Temp: 97.7 F (36.5 C)  TempSrc: Oral  SpO2: 94%  Weight: 224 lb 6 oz (101.8 kg)  Height: 5' 10.5" (1.791 m)   Body mass index is 31.74 kg/m.   Physical Exam Vitals reviewed.  Constitutional:      Appearance: Normal appearance.  HENT:     Head: Normocephalic.     Mouth/Throat:     Mouth: Mucous membranes are moist.     Pharynx: Oropharynx is clear.  Eyes:     Extraocular Movements: Extraocular movements intact.     Conjunctiva/sclera: Conjunctivae normal.     Pupils: Pupils are equal, round, and reactive to light.  Cardiovascular:     Rate and Rhythm: Normal rate and regular rhythm.     Pulses:  Normal pulses.     Heart sounds: Normal heart sounds.  Pulmonary:     Effort: Pulmonary effort is normal.     Breath sounds: Normal breath sounds.  Abdominal:     Palpations: Abdomen is soft.     Tenderness: There is no abdominal tenderness.  Musculoskeletal:        General: Normal range of motion.     Cervical back: No tenderness.  Lymphadenopathy:     Cervical: No cervical adenopathy.  Skin:    General: Skin is warm and dry.  Neurological:     General: No focal deficit present.     Mental Status: He is  alert and oriented to person, place, and time.  Psychiatric:        Mood and Affect: Mood normal.        Behavior: Behavior normal.      ASSESSMENT & PLAN: A total of 48 minutes was spent with the patient and counseling/coordination of care regarding preparing for this visit, review of available medical records, review of multiple chronic medical conditions and their management, review of all medications, need for colonoscopy for recurrent rectal bleeding, education on nutrition, prognosis, documentation, need for follow-up.  Problem List Items Addressed This Visit       Cardiovascular and Mediastinum   Recurrent deep vein thrombosis (HCC)    Stable.  On Xarelto 20 mg daily for lifetime.        Other   Hematochezia    Needs colonoscopy. GI referral placed today.      Long term (current) use of anticoagulants    Bleeding precautions given. Fall precautions given.      Dyslipidemia - Primary    Stable.  Diet and nutrition discussed.  Continue atorvastatin 40 mg daily.      Other Visit Diagnoses     Encounter to establish care       Need for vaccination       Relevant Orders   Flu Vaccine QUAD 6+ mos PF IM (Fluarix Quad PF)   Colon cancer screening       Relevant Orders   Ambulatory referral to Gastroenterology      Patient Instructions  Health Maintenance, Male Adopting a healthy lifestyle and getting preventive care are important in promoting health  and wellness. Ask your health care provider about: The right schedule for you to have regular tests and exams. Things you can do on your own to prevent diseases and keep yourself healthy. What should I know about diet, weight, and exercise? Eat a healthy diet  Eat a diet that includes plenty of vegetables, fruits, low-fat dairy products, and lean protein. Do not eat a lot of foods that are high in solid fats, added sugars, or sodium. Maintain a healthy weight Body mass index (BMI) is a measurement that can be used to identify possible weight problems. It estimates body fat based on height and weight. Your health care provider can help determine your BMI and help you achieve or maintain a healthy weight. Get regular exercise Get regular exercise. This is one of the most important things you can do for your health. Most adults should: Exercise for at least 150 minutes each week. The exercise should increase your heart rate and make you sweat (moderate-intensity exercise). Do strengthening exercises at least twice a week. This is in addition to the moderate-intensity exercise. Spend less time sitting. Even light physical activity can be beneficial. Watch cholesterol and blood lipids Have your blood tested for lipids and cholesterol at 59 years of age, then have this test every 5 years. You may need to have your cholesterol levels checked more often if: Your lipid or cholesterol levels are high. You are older than 59 years of age. You are at high risk for heart disease. What should I know about cancer screening? Many types of cancers can be detected early and may often be prevented. Depending on your health history and family history, you may need to have cancer screening at various ages. This may include screening for: Colorectal cancer. Prostate cancer. Skin cancer. Lung cancer. What should I know about heart disease, diabetes, and high blood pressure? Blood  pressure and heart disease High  blood pressure causes heart disease and increases the risk of stroke. This is more likely to develop in people who have high blood pressure readings or are overweight. Talk with your health care provider about your target blood pressure readings. Have your blood pressure checked: Every 3-5 years if you are 5-65 years of age. Every year if you are 70 years old or older. If you are between the ages of 42 and 21 and are a current or former smoker, ask your health care provider if you should have a one-time screening for abdominal aortic aneurysm (AAA). Diabetes Have regular diabetes screenings. This checks your fasting blood sugar level. Have the screening done: Once every three years after age 36 if you are at a normal weight and have a low risk for diabetes. More often and at a younger age if you are overweight or have a high risk for diabetes. What should I know about preventing infection? Hepatitis B If you have a higher risk for hepatitis B, you should be screened for this virus. Talk with your health care provider to find out if you are at risk for hepatitis B infection. Hepatitis C Blood testing is recommended for: Everyone born from [birth year] through 1963. Anyone with known risk factors for hepatitis C. Sexually transmitted infections (STIs) You should be screened each year for STIs, including gonorrhea and chlamydia, if: You are sexually active and are younger than 59 years of age. You are older than 59 years of age and your health care provider tells you that you are at risk for this type of infection. Your sexual activity has changed since you were last screened, and you are at increased risk for chlamydia or gonorrhea. Ask your health care provider if you are at risk. Ask your health care provider about whether you are at high risk for HIV. Your health care provider may recommend a prescription medicine to help prevent HIV infection. If you choose to take medicine to prevent HIV, you  should first get tested for HIV. You should then be tested every 3 months for as long as you are taking the medicine. Follow these instructions at home: Alcohol use Do not drink alcohol if your health care provider tells you not to drink. If you drink alcohol: Limit how much you have to 0-2 drinks a day. Know how much alcohol is in your drink. In the [place] one drink equals one 12 oz bottle of beer (355 mL), one 5 oz glass of wine (148 mL), or one 1 oz glass of hard liquor (44 mL). Lifestyle Do not use any products that contain nicotine or tobacco. These products include cigarettes, chewing tobacco, and vaping devices, such as e-cigarettes. If you need help quitting, ask your health care provider. Do not use street drugs. Do not share needles. Ask your health care provider for help if you need support or information about quitting drugs. General instructions Schedule regular health, dental, and eye exams. Stay current with your vaccines. Tell your health care provider if: You often feel depressed. You have ever been abused or do not feel safe at home. Summary Adopting a healthy lifestyle and getting preventive care are important in promoting health and wellness. Follow your health care provider's instructions about healthy diet, exercising, and getting tested or screened for diseases. Follow your health care provider's instructions on monitoring your cholesterol and blood pressure. This information is not intended to replace advice given to you by your health care  provider. Make sure you discuss any questions you have with your health care provider. Document Revised: 05/14/2020 Document Reviewed: 05/14/2020 Elsevier Patient Education  2023 Elsevier Inc.    Edwina Barth, MD [facility]

## 2021-11-06 NOTE — Assessment & Plan Note (Signed)
Stable.  Diet and nutrition discussed. Continue atorvastatin 40 mg daily. 

## 2021-11-06 NOTE — Assessment & Plan Note (Signed)
Needs colonoscopy. GI referral placed today.

## 2021-11-06 NOTE — Assessment & Plan Note (Signed)
Bleeding precautions given. Fall precautions given.

## 2021-11-06 NOTE — Patient Instructions (Signed)
Health Maintenance, Male Adopting a healthy lifestyle and getting preventive care are important in promoting health and wellness. Ask your health care provider about: The right schedule for you to have regular tests and exams. Things you can do on your own to prevent diseases and keep yourself healthy. What should I know about diet, weight, and exercise? Eat a healthy diet  Eat a diet that includes plenty of vegetables, fruits, low-fat dairy products, and lean protein. Do not eat a lot of foods that are high in solid fats, added sugars, or sodium. Maintain a healthy weight Body mass index (BMI) is a measurement that can be used to identify possible weight problems. It estimates body fat based on height and weight. Your health care provider can help determine your BMI and help you achieve or maintain a healthy weight. Get regular exercise Get regular exercise. This is one of the most important things you can do for your health. Most adults should: Exercise for at least 150 minutes each week. The exercise should increase your heart rate and make you sweat (moderate-intensity exercise). Do strengthening exercises at least twice a week. This is in addition to the moderate-intensity exercise. Spend less time sitting. Even light physical activity can be beneficial. Watch cholesterol and blood lipids Have your blood tested for lipids and cholesterol at 59 years of age, then have this test every 5 years. You may need to have your cholesterol levels checked more often if: Your lipid or cholesterol levels are high. You are older than 59 years of age. You are at high risk for heart disease. What should I know about cancer screening? Many types of cancers can be detected early and may often be prevented. Depending on your health history and family history, you may need to have cancer screening at various ages. This may include screening for: Colorectal cancer. Prostate cancer. Skin cancer. Lung  cancer. What should I know about heart disease, diabetes, and high blood pressure? Blood pressure and heart disease High blood pressure causes heart disease and increases the risk of stroke. This is more likely to develop in people who have high blood pressure readings or are overweight. Talk with your health care provider about your target blood pressure readings. Have your blood pressure checked: Every 3-5 years if you are 18-39 years of age. Every year if you are 40 years old or older. If you are between the ages of 65 and 75 and are a current or former smoker, ask your health care provider if you should have a one-time screening for abdominal aortic aneurysm (AAA). Diabetes Have regular diabetes screenings. This checks your fasting blood sugar level. Have the screening done: Once every three years after age 45 if you are at a normal weight and have a low risk for diabetes. More often and at a younger age if you are overweight or have a high risk for diabetes. What should I know about preventing infection? Hepatitis B If you have a higher risk for hepatitis B, you should be screened for this virus. Talk with your health care provider to find out if you are at risk for hepatitis B infection. Hepatitis C Blood testing is recommended for: Everyone born from 1945 through 1965. Anyone with known risk factors for hepatitis C. Sexually transmitted infections (STIs) You should be screened each year for STIs, including gonorrhea and chlamydia, if: You are sexually active and are younger than 59 years of age. You are older than 59 years of age and your   health care provider tells you that you are at risk for this type of infection. Your sexual activity has changed since you were last screened, and you are at increased risk for chlamydia or gonorrhea. Ask your health care provider if you are at risk. Ask your health care provider about whether you are at high risk for HIV. Your health care provider  may recommend a prescription medicine to help prevent HIV infection. If you choose to take medicine to prevent HIV, you should first get tested for HIV. You should then be tested every 3 months for as long as you are taking the medicine. Follow these instructions at home: Alcohol use Do not drink alcohol if your health care provider tells you not to drink. If you drink alcohol: Limit how much you have to 0-2 drinks a day. Know how much alcohol is in your drink. In the [place] one drink equals one 12 oz bottle of beer (355 mL), one 5 oz glass of wine (148 mL), or one 1 oz glass of hard liquor (44 mL). Lifestyle Do not use any products that contain nicotine or tobacco. These products include cigarettes, chewing tobacco, and vaping devices, such as e-cigarettes. If you need help quitting, ask your health care provider. Do not use street drugs. Do not share needles. Ask your health care provider for help if you need support or information about quitting drugs. General instructions Schedule regular health, dental, and eye exams. Stay current with your vaccines. Tell your health care provider if: You often feel depressed. You have ever been abused or do not feel safe at home. Summary Adopting a healthy lifestyle and getting preventive care are important in promoting health and wellness. Follow your health care provider's instructions about healthy diet, exercising, and getting tested or screened for diseases. Follow your health care provider's instructions on monitoring your cholesterol and blood pressure. This information is not intended to replace advice given to you by your health care provider. Make sure you discuss any questions you have with your health care provider. Document Revised: 05/14/2020 Document Reviewed: 05/14/2020 Elsevier Patient Education  2023 Elsevier Inc.  

## 2021-12-16 ENCOUNTER — Encounter: Payer: Self-pay | Admitting: Nurse Practitioner

## 2021-12-16 ENCOUNTER — Ambulatory Visit: Payer: Federal, State, Local not specified - PPO | Admitting: Nurse Practitioner

## 2021-12-16 ENCOUNTER — Telehealth: Payer: Self-pay

## 2021-12-16 ENCOUNTER — Other Ambulatory Visit (INDEPENDENT_AMBULATORY_CARE_PROVIDER_SITE_OTHER): Payer: Federal, State, Local not specified - PPO

## 2021-12-16 VITALS — BP 126/70 | HR 65 | Ht 71.0 in | Wt 226.0 lb

## 2021-12-16 DIAGNOSIS — K625 Hemorrhage of anus and rectum: Secondary | ICD-10-CM | POA: Diagnosis not present

## 2021-12-16 LAB — CBC
HCT: 45.1 % (ref 39.0–52.0)
Hemoglobin: 15.2 g/dL (ref 13.0–17.0)
MCHC: 33.6 g/dL (ref 30.0–36.0)
MCV: 87.7 fl (ref 78.0–100.0)
Platelets: 166 10*3/uL (ref 150.0–400.0)
RBC: 5.15 Mil/uL (ref 4.22–5.81)
RDW: 14.8 % (ref 11.5–15.5)
WBC: 5.2 10*3/uL (ref 4.0–10.5)

## 2021-12-16 MED ORDER — NA SULFATE-K SULFATE-MG SULF 17.5-3.13-1.6 GM/177ML PO SOLN: 1.0000 | Freq: Once | ORAL | 0 refills | Status: AC

## 2021-12-16 MED ORDER — HYDROCORTISONE ACETATE 25 MG RE SUPP: 25.0000 mg | Freq: Every day | RECTAL | 0 refills | Status: DC
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
## 2021-12-16 NOTE — Progress Notes (Signed)
I agree with the assessment and plan as outlined by Ms. Kennedy-Smith. 

## 2021-12-16 NOTE — Patient Instructions (Addendum)
You have been scheduled for a colonoscopy. Please follow written instructions given to you at your visit today.  Please pick up your prep supplies at the pharmacy within the next 1-3 days. If you use inhalers (even only as needed), please bring them with you on the day of your procedure.   Your provider has requested that you go to the basement level for lab work before leaving today. Press "B" on the elevator. The lab is located at the first door on the left as you exit the elevator.   We have sent the following medications to your pharmacy for you to pick up at your convenience: Suprep & Anusol suppositories   Miralax- every night as needed  Apply a small amount of Desitin inside the anal opening and to the external anal area twice daily as needed.  Due to recent changes in healthcare laws, you may see the results of your imaging and laboratory studies on MyChart before your provider has had a chance to review them.  We understand that in some cases there may be results that are confusing or concerning to you. Not all laboratory results come back in the same time frame and the provider may be waiting for multiple results in order to interpret others.  Please give [place] 48 hours in order for your provider to thoroughly review all the results before contacting the office for clarification of your results.    Thank you for trusting me with your gastrointestinal care!   Alcide Evener, CRNP

## 2021-12-16 NOTE — Telephone Encounter (Signed)
[facility] Pre-operative Risk Assessment     Request for surgical clearance:     Endoscopy Procedure  What type of surgery is being performed?    Colonoscopy  When is this surgery scheduled?      02/17/22  What type of clearance is required ?   Pharmacy  Are there any medications that need to be held prior to surgery and how long? Xarelto & 2 days  Practice name and name of physician performing surgery?      [facility]  What is your office phone and fax number?      Phone- [phone]  [phone]  Anesthesia type (None, local, MAC, general) ?       MAC

## 2021-12-23 NOTE — Telephone Encounter (Signed)
Contacted patient and patient is aware to hold his Xarelto 2 days prior to his procedure per Dr.Sagardia

## 2021-12-23 NOTE — Telephone Encounter (Signed)
Stop Xarelto for 2-3 days before procedure.

## 2022-01-17 ENCOUNTER — Telehealth: Payer: Self-pay | Admitting: Registered Nurse

## 2022-01-17 NOTE — Telephone Encounter (Signed)
MEDICATION:  rivaroxaban (XARELTO) 20 MG TABS tablet  atorvastatin (LIPITOR) 40 MG tablet   PHARMACY: [facility] on [address]  Comments: Patients old PCP left. He was seen by Dr Mitchel Honour 11/06/21, Was this considered a TOC or does he still need a TOC appt? And can the meds be filled  **Let patient know to contact pharmacy at the end of the day to make sure medication is ready. **  ** Please notify patient to allow 48-72 hours to process**  **Encourage patient to contact the pharmacy for refills or they can request refills through [facility]**

## 2022-01-18 ENCOUNTER — Other Ambulatory Visit: Payer: Self-pay | Admitting: Emergency Medicine

## 2022-01-18 DIAGNOSIS — Z7901 Long term (current) use of anticoagulants: Secondary | ICD-10-CM

## 2022-01-18 DIAGNOSIS — E785 Hyperlipidemia, unspecified: Secondary | ICD-10-CM

## 2022-01-18 DIAGNOSIS — Z86718 Personal history of other venous thrombosis and embolism: Secondary | ICD-10-CM

## 2022-01-18 MED ORDER — RIVAROXABAN 20 MG PO TABS: 20.0000 mg | ORAL_TABLET | Freq: Every day | ORAL | 3 refills | Status: DC

## 2022-01-18 MED ORDER — ATORVASTATIN CALCIUM 40 MG PO TABS: ORAL_TABLET | ORAL | 3 refills | Status: DC

## 2022-01-18 NOTE — Telephone Encounter (Signed)
New prescriptions sent to pharmacy of record.  Thanks.

## 2022-02-05 ENCOUNTER — Encounter: Payer: Self-pay | Admitting: Emergency Medicine

## 2022-02-05 ENCOUNTER — Encounter: Payer: Federal, State, Local not specified - PPO | Admitting: Registered Nurse

## 2022-02-05 ENCOUNTER — Ambulatory Visit (INDEPENDENT_AMBULATORY_CARE_PROVIDER_SITE_OTHER): Payer: Federal, State, Local not specified - PPO | Admitting: Emergency Medicine

## 2022-02-05 VITALS — BP 132/78 | HR 71 | Temp 97.8°F | Ht 71.0 in | Wt 226.5 lb

## 2022-02-05 DIAGNOSIS — Z1329 Encounter for screening for other suspected endocrine disorder: Secondary | ICD-10-CM | POA: Diagnosis not present

## 2022-02-05 DIAGNOSIS — Z13228 Encounter for screening for other metabolic disorders: Secondary | ICD-10-CM

## 2022-02-05 DIAGNOSIS — Z13220 Encounter for screening for lipoid disorders: Secondary | ICD-10-CM

## 2022-02-05 DIAGNOSIS — Z130 Encounter for screening for diseases of the blood and blood-forming organs and certain disorders involving the immune mechanism: Secondary | ICD-10-CM

## 2022-02-05 DIAGNOSIS — Z125 Encounter for screening for malignant neoplasm of prostate: Secondary | ICD-10-CM

## 2022-02-05 DIAGNOSIS — Z0000 Encounter for general adult medical examination without abnormal findings: Secondary | ICD-10-CM | POA: Diagnosis not present

## 2022-02-05 LAB — COMPREHENSIVE METABOLIC PANEL
ALT: 36 U/L (ref 0–53)
AST: 28 U/L (ref 0–37)
Albumin: 4.2 g/dL (ref 3.5–5.2)
Alkaline Phosphatase: 98 U/L (ref 39–117)
BUN: 12 mg/dL (ref 6–23)
CO2: 29 mEq/L (ref 19–32)
Calcium: 9.6 mg/dL (ref 8.4–10.5)
Chloride: 104 mEq/L (ref 96–112)
Creatinine, Ser: 1.16 mg/dL (ref 0.40–1.50)
GFR: 68.84 mL/min (ref >60.00)
Glucose, Bld: 111 mg/dL — ABNORMAL HIGH (ref 70–99)
Potassium: 4.3 mEq/L (ref 3.5–5.1)
Sodium: 139 mEq/L (ref 135–145)
Total Bilirubin: 0.8 mg/dL (ref 0.2–1.2)
Total Protein: 7.6 g/dL (ref 6.0–8.3)

## 2022-02-05 LAB — CBC WITH DIFFERENTIAL/PLATELET
Basophils Absolute: 0 10*3/uL (ref 0.0–0.1)
Basophils Relative: 0.3 % (ref 0.0–3.0)
Eosinophils Absolute: 0.2 10*3/uL (ref 0.0–0.7)
Eosinophils Relative: 4.1 % (ref 0.0–5.0)
HCT: 44.8 % (ref 39.0–52.0)
Hemoglobin: 15.1 g/dL (ref 13.0–17.0)
Lymphocytes Relative: 22.6 % (ref 12.0–46.0)
Lymphs Abs: 1.2 10*3/uL (ref 0.7–4.0)
MCHC: 33.6 g/dL (ref 30.0–36.0)
MCV: 87.6 fl (ref 78.0–100.0)
Monocytes Absolute: 0.5 10*3/uL (ref 0.1–1.0)
Monocytes Relative: 10.2 % (ref 3.0–12.0)
Neutro Abs: 3.2 10*3/uL (ref 1.4–7.7)
Neutrophils Relative %: 62.8 % (ref 43.0–77.0)
Platelets: 169 10*3/uL (ref 150.0–400.0)
RBC: 5.12 Mil/uL (ref 4.22–5.81)
RDW: 14.8 % (ref 11.5–15.5)
WBC: 5.2 10*3/uL (ref 4.0–10.5)

## 2022-02-05 LAB — LIPID PANEL
Cholesterol: 169 mg/dL (ref 0–200)
HDL: 47.2 mg/dL (ref >39.00)
LDL Cholesterol: 102 mg/dL — ABNORMAL HIGH (ref 0–99)
NonHDL: 121.84
Total CHOL/HDL Ratio: 4
Triglycerides: 97 mg/dL (ref 0.0–149.0)
VLDL: 19.4 mg/dL (ref 0.0–40.0)

## 2022-02-05 LAB — PSA: PSA: 1.29 ng/mL (ref 0.10–4.00)

## 2022-02-05 LAB — HEMOGLOBIN A1C: Hgb A1c MFr Bld: 6.3 % (ref 4.6–6.5)

## 2022-02-05 NOTE — Progress Notes (Signed)
Ryan Gutierrez 62 y.o.   Chief Complaint  Patient presents with   Annual Exam    Left side buttock discomfort when patient lay down     HISTORY OF PRESENT ILLNESS: This is a 62 y.o. male here for annual exam. Has occasional discomfort to left buttock area when lying down No other complaints or medical concerns today.  HPI   Prior to Admission medications   Medication Sig Start Date End Date Taking? Authorizing Provider  atorvastatin (LIPITOR) 40 MG tablet TAKE 1 TABLET(40 MG) BY MOUTH DAILY 01/18/22  Yes Tonantzin Mimnaugh, Eilleen Kempf, MD  rivaroxaban (XARELTO) 20 MG TABS tablet Take 1 tablet (20 mg total) by mouth daily with supper. 01/18/22  Yes Georgina Quint, MD    No Known Allergies  Patient Active Problem List   Diagnosis Date Noted   Dyslipidemia 11/06/2021   Mixed hyperlipidemia 05/26/2013   Long term (current) use of anticoagulants 05/26/2013   Recurrent deep vein thrombosis (HCC) 10/24/2011    Past Medical History:  Diagnosis Date   Clotting disorder ([facility])    DVT (deep venous thrombosis) (HCC)    post-knee surgery   Hyperlipidemia    Sickle cell trait ([facility])     Past Surgical History:  Procedure Laterality Date   HERNIA REPAIR     KNEE SURGERY  1990s    Social History   Socioeconomic History   Marital status: Married    Spouse name: Not on file   Number of children: 2   Years of education: Not on file   Highest education level: Not on file  Occupational History   Not on file  Tobacco Use   Smoking status: Never   Smokeless tobacco: Never  Vaping Use   Vaping Use: Never used  Substance and Sexual Activity   Alcohol use: Yes    Alcohol/week: 0.0 standard drinks of alcohol    Comment: occ   Drug use: No   Sexual activity: Yes  Other Topics Concern   Not on file  Social History Narrative   Not on file   Social Determinants of Health   Financial Resource Strain: Low Risk  (12/14/2018)   Overall Financial Resource Strain (CARDIA)     Difficulty of Paying Living Expenses: Not hard at all  Food Insecurity: No Food Insecurity (12/14/2018)   Hunger Vital Sign    Worried About Running Out of Food in the Last Year: Never true    Ran Out of Food in the Last Year: Never true  Transportation Needs: No Transportation Needs (12/14/2018)   PRAPARE - Administrator, Civil Service (Medical): No    Lack of Transportation (Non-Medical): No  Physical Activity: Insufficiently Active (12/14/2018)   Exercise Vital Sign    Days of Exercise per Week: 3 days    Minutes of Exercise per Session: 30 min  Stress: No Stress Concern Present (12/14/2018)   [employer] of Occupational Health - Occupational Stress Questionnaire    Feeling of Stress : Only a little  Social Connections: Unknown (12/14/2018)   Social Connection and Isolation Panel [NHANES]    Frequency of Communication with Friends and Family: Three times a week    Frequency of Social Gatherings with Friends and Family: Twice a week    Attends Religious Services: Patient refused    Active Member of Clubs or Organizations: Patient refused    Attends Banker Meetings: Patient refused    Marital Status: Patient refused  Intimate Partner Violence: Not At Risk (  12/14/2018)   Humiliation, Afraid, Rape, and Kick questionnaire    Fear of Current or Ex-Partner: No    Emotionally Abused: No    Physically Abused: No    Sexually Abused: No    Family History  Problem Relation Age of Onset   Heart disease Mother    Heart attack Mother    Diabetes Sister    Hypertension Sister    Sickle cell anemia Son      Review of Systems  Constitutional: Negative.  Negative for chills and fever.  HENT: Negative.  Negative for congestion and sore throat.   Eyes: Negative.   Respiratory: Negative.  Negative for cough and shortness of breath.   Cardiovascular: Negative.  Negative for chest pain and palpitations.  Gastrointestinal: Negative.  Negative for abdominal pain,  diarrhea, nausea and vomiting.  Genitourinary: Negative.  Negative for dysuria and hematuria.  Skin: Negative.  Negative for rash.  Neurological: Negative.  Negative for dizziness and headaches.  All other systems reviewed and are negative.  Today's Vitals   02/05/22 0925  BP: 132/78  Pulse: 71  Temp: 97.8 F (36.6 C)  TempSrc: Oral  SpO2: 94%  Weight: 226 lb 8 oz (102.7 kg)  Height: 5\' 11"  (1.803 m)   Body mass index is 31.59 kg/m.   Physical Exam Vitals reviewed.  Constitutional:      Appearance: Normal appearance.  HENT:     Head: Normocephalic.     Right Ear: Tympanic membrane, ear canal and external ear normal.     Left Ear: Tympanic membrane, ear canal and external ear normal.     Mouth/Throat:     Mouth: Mucous membranes are moist.     Pharynx: Oropharynx is clear.  Eyes:     Extraocular Movements: Extraocular movements intact.     Conjunctiva/sclera: Conjunctivae normal.     Pupils: Pupils are equal, round, and reactive to light.  Cardiovascular:     Rate and Rhythm: Normal rate and regular rhythm.     Pulses: Normal pulses.     Heart sounds: Normal heart sounds.  Pulmonary:     Effort: Pulmonary effort is normal.     Breath sounds: Normal breath sounds.  Abdominal:     Palpations: Abdomen is soft.     Tenderness: There is no abdominal tenderness.  Musculoskeletal:     Cervical back: No tenderness.     Right lower leg: No edema.     Left lower leg: No edema.  Lymphadenopathy:     Cervical: No cervical adenopathy.  Skin:    General: Skin is warm and dry.  Neurological:     General: No focal deficit present.     Mental Status: He is alert and oriented to person, place, and time.  Psychiatric:        Mood and Affect: Mood normal.        Behavior: Behavior normal.      ASSESSMENT & PLAN: Problem List Items Addressed This Visit   None Visit Diagnoses     Routine general medical examination at a health care facility    -  Primary   Relevant  Orders   CBC with Differential   Comprehensive metabolic panel   Hemoglobin A1c   Lipid panel   PSA(Must document that pt has been informed of limitations of PSA testing.)   Prostate cancer screening       Relevant Orders   PSA(Must document that pt has been informed of limitations of PSA testing.)  Screening for deficiency anemia       Relevant Orders   CBC with Differential   Screening for lipoid disorders       Relevant Orders   Lipid panel   Screening for endocrine, metabolic and immunity disorder       Relevant Orders   Comprehensive metabolic panel   Hemoglobin A1c      Modifiable risk factors discussed with patient. Anticipatory guidance according to age provided. The following topics were also discussed: Social Determinants of Health Smoking.  Non-smoker Diet and nutrition Benefits of exercise Cancer screening.  Scheduled for colonoscopy next month Vaccinations reviewed and recommendations Cardiovascular risk assessment and need for blood work Review of chronic medical conditions Review of all medications Mental health including depression and anxiety Fall and accident prevention  Patient Instructions  Health Maintenance, Male Adopting a healthy lifestyle and getting preventive care are important in promoting health and wellness. Ask your health care provider about: The right schedule for you to have regular tests and exams. Things you can do on your own to prevent diseases and keep yourself healthy. What should I know about diet, weight, and exercise? Eat a healthy diet  Eat a diet that includes plenty of vegetables, fruits, low-fat dairy products, and lean protein. Do not eat a lot of foods that are high in solid fats, added sugars, or sodium. Maintain a healthy weight Body mass index (BMI) is a measurement that can be used to identify possible weight problems. It estimates body fat based on height and weight. Your health care provider can help determine  your BMI and help you achieve or maintain a healthy weight. Get regular exercise Get regular exercise. This is one of the most important things you can do for your health. Most adults should: Exercise for at least 150 minutes each week. The exercise should increase your heart rate and make you sweat (moderate-intensity exercise). Do strengthening exercises at least twice a week. This is in addition to the moderate-intensity exercise. Spend less time sitting. Even light physical activity can be beneficial. Watch cholesterol and blood lipids Have your blood tested for lipids and cholesterol at 60 years of age, then have this test every 5 years. You may need to have your cholesterol levels checked more often if: Your lipid or cholesterol levels are high. You are older than 60 years of age. You are at high risk for heart disease. What should I know about cancer screening? Many types of cancers can be detected early and may often be prevented. Depending on your health history and family history, you may need to have cancer screening at various ages. This may include screening for: Colorectal cancer. Prostate cancer. Skin cancer. Lung cancer. What should I know about heart disease, diabetes, and high blood pressure? Blood pressure and heart disease High blood pressure causes heart disease and increases the risk of stroke. This is more likely to develop in people who have high blood pressure readings or are overweight. Talk with your health care provider about your target blood pressure readings. Have your blood pressure checked: Every 3-5 years if you are 81-64 years of age. Every year if you are 9 years old or older. If you are between the ages of 57 and 50 and are a current or former smoker, ask your health care provider if you should have a one-time screening for abdominal aortic aneurysm (AAA). Diabetes Have regular diabetes screenings. This checks your fasting blood sugar level. Have the  screening done: Once  every three years after age 60 if you are at a normal weight and have a low risk for diabetes. More often and at a younger age if you are overweight or have a high risk for diabetes. What should I know about preventing infection? Hepatitis B If you have a higher risk for hepatitis B, you should be screened for this virus. Talk with your health care provider to find out if you are at risk for hepatitis B infection. Hepatitis C Blood testing is recommended for: Everyone born from [birth year] through 1963. Anyone with known risk factors for hepatitis C. Sexually transmitted infections (STIs) You should be screened each year for STIs, including gonorrhea and chlamydia, if: You are sexually active and are younger than 60 years of age. You are older than 60 years of age and your health care provider tells you that you are at risk for this type of infection. Your sexual activity has changed since you were last screened, and you are at increased risk for chlamydia or gonorrhea. Ask your health care provider if you are at risk. Ask your health care provider about whether you are at high risk for HIV. Your health care provider may recommend a prescription medicine to help prevent HIV infection. If you choose to take medicine to prevent HIV, you should first get tested for HIV. You should then be tested every 3 months for as long as you are taking the medicine. Follow these instructions at home: Alcohol use Do not drink alcohol if your health care provider tells you not to drink. If you drink alcohol: Limit how much you have to 0-2 drinks a day. Know how much alcohol is in your drink. In the [place] one drink equals one 12 oz bottle of beer (355 mL), one 5 oz glass of wine (148 mL), or one 1 oz glass of hard liquor (44 mL). Lifestyle Do not use any products that contain nicotine or tobacco. These products include cigarettes, chewing tobacco, and vaping devices, such as e-cigarettes. If you  need help quitting, ask your health care provider. Do not use street drugs. Do not share needles. Ask your health care provider for help if you need support or information about quitting drugs. General instructions Schedule regular health, dental, and eye exams. Stay current with your vaccines. Tell your health care provider if: You often feel depressed. You have ever been abused or do not feel safe at home. Summary Adopting a healthy lifestyle and getting preventive care are important in promoting health and wellness. Follow your health care provider's instructions about healthy diet, exercising, and getting tested or screened for diseases. Follow your health care provider's instructions on monitoring your cholesterol and blood pressure. This information is not intended to replace advice given to you by your health care provider. Make sure you discuss any questions you have with your health care provider. Document Revised: 05/14/2020 Document Reviewed: 05/14/2020 Elsevier Patient Education  [place], MD [facility]

## 2022-02-05 NOTE — Patient Instructions (Signed)
Health Maintenance, Male Adopting a healthy lifestyle and getting preventive care are important in promoting health and wellness. Ask your health care provider about: The right schedule for you to have regular tests and exams. Things you can do on your own to prevent diseases and keep yourself healthy. What should I know about diet, weight, and exercise? Eat a healthy diet  Eat a diet that includes plenty of vegetables, fruits, low-fat dairy products, and lean protein. Do not eat a lot of foods that are high in solid fats, added sugars, or sodium. Maintain a healthy weight Body mass index (BMI) is a measurement that can be used to identify possible weight problems. It estimates body fat based on height and weight. Your health care provider can help determine your BMI and help you achieve or maintain a healthy weight. Get regular exercise Get regular exercise. This is one of the most important things you can do for your health. Most adults should: Exercise for at least 150 minutes each week. The exercise should increase your heart rate and make you sweat (moderate-intensity exercise). Do strengthening exercises at least twice a week. This is in addition to the moderate-intensity exercise. Spend less time sitting. Even light physical activity can be beneficial. Watch cholesterol and blood lipids Have your blood tested for lipids and cholesterol at 60 years of age, then have this test every 5 years. You may need to have your cholesterol levels checked more often if: Your lipid or cholesterol levels are high. You are older than 60 years of age. You are at high risk for heart disease. What should I know about cancer screening? Many types of cancers can be detected early and may often be prevented. Depending on your health history and family history, you may need to have cancer screening at various ages. This may include screening for: Colorectal cancer. Prostate cancer. Skin cancer. Lung  cancer. What should I know about heart disease, diabetes, and high blood pressure? Blood pressure and heart disease High blood pressure causes heart disease and increases the risk of stroke. This is more likely to develop in people who have high blood pressure readings or are overweight. Talk with your health care provider about your target blood pressure readings. Have your blood pressure checked: Every 3-5 years if you are 18-39 years of age. Every year if you are 40 years old or older. If you are between the ages of 65 and 75 and are a current or former smoker, ask your health care provider if you should have a one-time screening for abdominal aortic aneurysm (AAA). Diabetes Have regular diabetes screenings. This checks your fasting blood sugar level. Have the screening done: Once every three years after age 45 if you are at a normal weight and have a low risk for diabetes. More often and at a younger age if you are overweight or have a high risk for diabetes. What should I know about preventing infection? Hepatitis B If you have a higher risk for hepatitis B, you should be screened for this virus. Talk with your health care provider to find out if you are at risk for hepatitis B infection. Hepatitis C Blood testing is recommended for: Everyone born from 1945 through 1965. Anyone with known risk factors for hepatitis C. Sexually transmitted infections (STIs) You should be screened each year for STIs, including gonorrhea and chlamydia, if: You are sexually active and are younger than 60 years of age. You are older than 60 years of age and your   health care provider tells you that you are at risk for this type of infection. Your sexual activity has changed since you were last screened, and you are at increased risk for chlamydia or gonorrhea. Ask your health care provider if you are at risk. Ask your health care provider about whether you are at high risk for HIV. Your health care provider  may recommend a prescription medicine to help prevent HIV infection. If you choose to take medicine to prevent HIV, you should first get tested for HIV. You should then be tested every 3 months for as long as you are taking the medicine. Follow these instructions at home: Alcohol use Do not drink alcohol if your health care provider tells you not to drink. If you drink alcohol: Limit how much you have to 0-2 drinks a day. Know how much alcohol is in your drink. In the [place] one drink equals one 12 oz bottle of beer (355 mL), one 5 oz glass of wine (148 mL), or one 1 oz glass of hard liquor (44 mL). Lifestyle Do not use any products that contain nicotine or tobacco. These products include cigarettes, chewing tobacco, and vaping devices, such as e-cigarettes. If you need help quitting, ask your health care provider. Do not use street drugs. Do not share needles. Ask your health care provider for help if you need support or information about quitting drugs. General instructions Schedule regular health, dental, and eye exams. Stay current with your vaccines. Tell your health care provider if: You often feel depressed. You have ever been abused or do not feel safe at home. Summary Adopting a healthy lifestyle and getting preventive care are important in promoting health and wellness. Follow your health care provider's instructions about healthy diet, exercising, and getting tested or screened for diseases. Follow your health care provider's instructions on monitoring your cholesterol and blood pressure. This information is not intended to replace advice given to you by your health care provider. Make sure you discuss any questions you have with your health care provider. Document Revised: 05/14/2020 Document Reviewed: 05/14/2020 Elsevier Patient Education  2023 Elsevier Inc.  

## 2022-02-17 ENCOUNTER — Encounter: Payer: Federal, State, Local not specified - PPO | Admitting: Internal Medicine

## 2022-02-18 ENCOUNTER — Encounter: Payer: Self-pay | Admitting: Certified Registered Nurse Anesthetist

## 2022-02-24 ENCOUNTER — Telehealth: Payer: Self-pay | Admitting: Internal Medicine

## 2022-02-24 ENCOUNTER — Encounter: Payer: Federal, State, Local not specified - PPO | Admitting: Internal Medicine

## 2022-02-24 NOTE — Telephone Encounter (Signed)
Noted! Thank you

## 2022-02-24 NOTE — Telephone Encounter (Signed)
Patient called said he forgot to stop his blood thinning medication and requested to reschedule his procedure for today.

## 2022-03-28 ENCOUNTER — Telehealth: Payer: Self-pay | Admitting: *Deleted

## 2022-03-28 ENCOUNTER — Ambulatory Visit (AMBULATORY_SURGERY_CENTER): Payer: Federal, State, Local not specified - PPO | Admitting: *Deleted

## 2022-03-28 VITALS — Ht 71.0 in | Wt 225.0 lb

## 2022-03-28 DIAGNOSIS — K625 Hemorrhage of anus and rectum: Secondary | ICD-10-CM

## 2022-03-28 NOTE — Telephone Encounter (Signed)
1100- attempted to reach pt for PV; no answer. LMOM that I will try him again in a few minutes; if he did receive this call, please call office back.  1110-  attempted to reach pt- straight to voicemail.  1116- attempted to reach pt again,  LMOM to call office prior to 5:00 pm so PV can be rescheduled or will have to cancel colonoscopy on 04-28-22

## 2022-03-28 NOTE — Telephone Encounter (Signed)
Pt called back.  PV completed 

## 2022-03-28 NOTE — Progress Notes (Signed)
Pt's previsit is done over the phone and all paperwork (prep instructions) sent to patient.  Pt's name and DOB verified at the beginning of the previsit.  Pt denies any difficulty with ambulating.   No egg or soy allergy known to patient  No issues known to pt with past sedation with any surgeries or procedures Patient denies ever being told they had issues or difficulty with intubation  No FH of Malignant Hyperthermia Pt is not on diet pills Pt is not on  home 02  Pt is not on blood thinners  Pt denies issues with constipation  Pt is not on dialysis Pt denies any upcoming cardiac testing Pt already has Suprep at home  Patient's chart reviewed by Osvaldo Angst CNRA prior to previsit and patient appropriate for the [facility].  Previsit completed and red dot placed by patient's name on their procedure day (on provider's schedule).

## 2022-04-21 ENCOUNTER — Telehealth: Payer: Self-pay | Admitting: Internal Medicine

## 2022-04-21 NOTE — Telephone Encounter (Signed)
PT instructions advise that he takes a capful of Miralax a day 7 days prior to procedure. He wants to know do he take it same day as he starts to drink prep medication. Please advise

## 2022-04-21 NOTE — Telephone Encounter (Signed)
Call returned VM left pt to call back if he has further questions or needs clarification

## 2022-04-28 ENCOUNTER — Ambulatory Visit (AMBULATORY_SURGERY_CENTER): Payer: Federal, State, Local not specified - PPO | Admitting: Internal Medicine

## 2022-04-28 ENCOUNTER — Encounter: Payer: Self-pay | Admitting: Internal Medicine

## 2022-04-28 VITALS — BP 120/73 | HR 68 | Temp 97.3°F | Resp 17 | Ht 71.0 in | Wt 225.0 lb

## 2022-04-28 DIAGNOSIS — D128 Benign neoplasm of rectum: Secondary | ICD-10-CM | POA: Diagnosis not present

## 2022-04-28 DIAGNOSIS — K635 Polyp of colon: Secondary | ICD-10-CM | POA: Diagnosis not present

## 2022-04-28 DIAGNOSIS — D123 Benign neoplasm of transverse colon: Secondary | ICD-10-CM

## 2022-04-28 DIAGNOSIS — K625 Hemorrhage of anus and rectum: Secondary | ICD-10-CM | POA: Diagnosis not present

## 2022-04-28 MED ORDER — SODIUM CHLORIDE 0.9 % IV SOLN
500.0000 mL | INTRAVENOUS | Status: DC
Start: 2022-04-28 — End: 2022-04-28

## 2022-04-28 NOTE — Progress Notes (Signed)
Called to room to assist during endoscopic procedure.  Patient ID and intended procedure confirmed with present staff. Received instructions for my participation in the procedure from the performing physician.  

## 2022-04-28 NOTE — Progress Notes (Signed)
GASTROENTEROLOGY PROCEDURE H&P NOTE   Primary Care Physician: Georgina Quint, MD    Reason for Procedure:   Rectal bleeding  Plan:    Colonoscopy  Patient is appropriate for endoscopic procedure(s) in the ambulatory ([facility]) setting.  The nature of the procedure, as well as the risks, benefits, and alternatives were carefully and thoroughly reviewed with the patient. Ample time for discussion and questions allowed. The patient understood, was satisfied, and agreed to proceed.     HPI: Ryan Gutierrez is a 62 y.o. male who presents for colonoscopy for evaluation of rectal bleeding .  Patient was most recently seen in the Gastroenterology Clinic on 12/16/21.  No interval change in medical history since that appointment. Please refer to that note for full details regarding GI history and clinical presentation.   Past Medical History:  Diagnosis Date   Clotting disorder (HCC)    DVT (deep venous thrombosis) (HCC)    post-knee surgery   Hyperlipidemia    Sickle cell trait (HCC)     Past Surgical History:  Procedure Laterality Date   COLONOSCOPY     HERNIA REPAIR     KNEE SURGERY  1990s    Prior to Admission medications   Medication Sig Start Date End Date Taking? Authorizing Provider  atorvastatin (LIPITOR) 40 MG tablet TAKE 1 TABLET(40 MG) BY MOUTH DAILY 01/18/22   Georgina Quint, MD  rivaroxaban (XARELTO) 20 MG TABS tablet Take 1 tablet (20 mg total) by mouth daily with supper. 01/18/22   Georgina Quint, MD    Current Outpatient Medications  Medication Sig Dispense Refill   atorvastatin (LIPITOR) 40 MG tablet TAKE 1 TABLET(40 MG) BY MOUTH DAILY 90 tablet 3   rivaroxaban (XARELTO) 20 MG TABS tablet Take 1 tablet (20 mg total) by mouth daily with supper. 90 tablet 3   No current facility-administered medications for this visit.    Allergies as of 04/28/2022   (No Known Allergies)    Family History  Problem Relation Age of Onset   Heart disease  Mother    Heart attack Mother    Diabetes Sister    Hypertension Sister    Sickle cell anemia Son    Colon cancer Neg Hx    Esophageal cancer Neg Hx    Stomach cancer Neg Hx    Rectal cancer Neg Hx     Social History   Socioeconomic History   Marital status: Married    Spouse name: Not on file   Number of children: 2   Years of education: Not on file   Highest education level: Not on file  Occupational History   Not on file  Tobacco Use   Smoking status: Never   Smokeless tobacco: Never  Vaping Use   Vaping Use: Never used  Substance and Sexual Activity   Alcohol use: Yes    Alcohol/week: 0.0 standard drinks of alcohol    Comment: occ   Drug use: No   Sexual activity: Yes  Other Topics Concern   Not on file  Social History Narrative   Not on file   Social Determinants of Health   Financial Resource Strain: Low Risk  (12/14/2018)   Overall Financial Resource Strain (CARDIA)    Difficulty of Paying Living Expenses: Not hard at all  Food Insecurity: No Food Insecurity (12/14/2018)   Hunger Vital Sign    Worried About Running Out of Food in the Last Year: Never true    Ran Out of Food in  the Last Year: Never true  Transportation Needs: No Transportation Needs (12/14/2018)   PRAPARE - Administrator, Civil Service (Medical): No    Lack of Transportation (Non-Medical): No  Physical Activity: Insufficiently Active (12/14/2018)   Exercise Vital Sign    Days of Exercise per Week: 3 days    Minutes of Exercise per Session: 30 min  Stress: No Stress Concern Present (12/14/2018)   [employer] of Occupational Health - Occupational Stress Questionnaire    Feeling of Stress : Only a little  Social Connections: Unknown (12/14/2018)   Social Connection and Isolation Panel [NHANES]    Frequency of Communication with Friends and Family: Three times a week    Frequency of Social Gatherings with Friends and Family: Twice a week    Attends Religious Services: Patient  declined    Database administrator or Organizations: Patient declined    Attends Banker Meetings: Patient declined    Marital Status: Patient declined  Intimate Partner Violence: Not At Risk (12/14/2018)   Humiliation, Afraid, Rape, and Kick questionnaire    Fear of Current or Ex-Partner: No    Emotionally Abused: No    Physically Abused: No    Sexually Abused: No    Physical Exam: Vital signs in last 24 hours: BP (!) 123/59   Pulse (!) 58   Temp (!) 97.3 F (36.3 C)   Ht  (1.803 m)   Wt 225 lb (102.1 kg)   SpO2 99%   BMI 31.38 kg/m  GEN: NAD EYE: Sclerae anicteric ENT: MMM CV: Non-tachycardic Pulm: No increased WOB GI: Soft NEURO:  Alert & Oriented   Eulah Pont, MD [facility]   04/28/2022 8:55 AM

## 2022-04-28 NOTE — Op Note (Addendum)
[facility] Patient Name: Ryan Gutierrez Procedure Date: 04/28/2022 9:40 AM MRN: [id number] Endoscopist: Madelyn Brunner [place] , , [phone] Age: 62 Referring MD:  Date of Birth: 08/26/1960 Gender: Male Account #: [id number] Procedure:                Colonoscopy Indications:              Rectal bleeding Medicines:                Monitored Anesthesia Care Procedure:                Pre-Anesthesia Assessment:                           - Prior to the procedure, a History and Physical                            was performed, and patient medications and                            allergies were reviewed. The patient's tolerance of                            previous anesthesia was also reviewed. The risks                            and benefits of the procedure and the sedation                            options and risks were discussed with the patient.                            All questions were answered, and informed consent                            was obtained. Prior Anticoagulants: The patient has                            taken Xarelto (rivaroxaban), last dose was 3 days                            prior to procedure. ASA Grade Assessment: II - A                            patient with mild systemic disease. After reviewing                            the risks and benefits, the patient was deemed in                            satisfactory condition to undergo the procedure.                           After obtaining informed consent, the colonoscope  was passed under direct vision. Throughout the                            procedure, the patient's blood pressure, pulse, and                            oxygen saturations were monitored continuously. The                            Olympus CF-HQ190L ([id number]) Colonoscope was                            introduced through the anus and advanced to the the                            terminal ileum. The  colonoscopy was performed                            without difficulty. The patient tolerated the                            procedure well. The quality of the bowel                            preparation was good. The terminal ileum, ileocecal                            valve, appendiceal orifice, and rectum were                            photographed. Scope In: 9:44:41 AM Scope Out: 10:02:49 AM Scope Withdrawal Time: 0 hours 14 minutes 23 seconds  Total Procedure Duration: 0 hours 18 minutes 8 seconds  Findings:                 The terminal ileum appeared normal.                           Three sessile polyps were found in the rectum and                            transverse colon. The polyps were 3 to 4 mm in                            size. These polyps were removed with a cold snare.                            Resection and retrieval were complete.                           Non-bleeding internal hemorrhoids were found during                            retroflexion. Complications:  No immediate complications. Estimated Blood Loss:     Estimated blood loss was minimal. Impression:               - The examined portion of the ileum was normal.                           - Three 3 to 4 mm polyps in the rectum and in the                            transverse colon, removed with a cold snare.                            Resected and retrieved.                           - Non-bleeding internal hemorrhoids. Recommendation:           - Discharge patient to home (with escort).                           - Await pathology results.                           - Okay to restart Xarelto tomorrow.                           - If rectal bleeding persists, can consider                            hemorrhoidal banding in the future.                           - The findings and recommendations were discussed                            with the patient. Dr Particia Lather "[place]" [place],   04/28/2022 10:08:43 AM

## 2022-04-28 NOTE — Progress Notes (Signed)
Pt resting comfortably. VSS. Airway intact. SBAR complete to RN. All questions answered.   

## 2022-04-28 NOTE — Patient Instructions (Signed)
Discharge instructions given. Handouts on polyps and hemorrhoids. Resume previous medications. Resume Xarelto tomorrow. YOU HAD AN ENDOSCOPIC PROCEDURE TODAY AT THE [facility]:   Refer to the procedure report that was given to you for any specific questions about what was found during the examination.  If the procedure report does not answer your questions, please call your gastroenterologist to clarify.  If you requested that your care partner not be given the details of your procedure findings, then the procedure report has been included in a sealed envelope for you to review at your convenience later.  YOU SHOULD EXPECT: Some feelings of bloating in the abdomen. Passage of more gas than usual.  Walking can help get rid of the air that was put into your GI tract during the procedure and reduce the bloating. If you had a lower endoscopy (such as a colonoscopy or flexible sigmoidoscopy) you may notice spotting of blood in your stool or on the toilet paper. If you underwent a bowel prep for your procedure, you may not have a normal bowel movement for a few days.  Please Note:  You might notice some irritation and congestion in your nose or some drainage.  This is from the oxygen used during your procedure.  There is no need for concern and it should clear up in a day or so.  SYMPTOMS TO REPORT IMMEDIATELY:  Following lower endoscopy (colonoscopy or flexible sigmoidoscopy):  Excessive amounts of blood in the stool  Significant tenderness or worsening of abdominal pains  Swelling of the abdomen that is new, acute  Fever of 100F or higher   For urgent or emergent issues, a gastroenterologist can be reached at any hour by calling [phone]. Do not use MyChart messaging for urgent concerns.    DIET:  We do recommend a small meal at first, but then you may proceed to your regular diet.  Drink plenty of fluids but you should avoid alcoholic beverages for 24 hours.  ACTIVITY:  You  should plan to take it easy for the rest of today and you should NOT DRIVE or use heavy machinery until tomorrow (because of the sedation medicines used during the test).    FOLLOW UP: Our staff will call the number listed on your records the next business day following your procedure.  We will call around 7:15- 8:00 am to check on you and address any questions or concerns that you may have regarding the information given to you following your procedure. If we do not reach you, we will leave a message.     If any biopsies were taken you will be contacted by phone or by letter within the next 1-3 weeks.  Please call us at [phone] if you have not heard about the biopsies in 3 weeks.    SIGNATURES/CONFIDENTIALITY: You and/or your care partner have signed paperwork which will be entered into your electronic medical record.  These signatures attest to the fact that that the information above on your After Visit Summary has been reviewed and is understood.  Full responsibility of the confidentiality of this discharge information lies with you and/or your care-partner.

## 2022-04-29 ENCOUNTER — Telehealth: Payer: Self-pay

## 2022-04-29 NOTE — Telephone Encounter (Signed)
  Follow up Call-     04/28/2022    8:52 AM  Call back number  Post procedure Call Back phone  # [phone]  Permission to leave phone message Yes     Patient questions:  Do you have a fever, pain , or abdominal swelling? No. Pain Score  0 *  Have you tolerated food without any problems? Yes.    Have you been able to return to your normal activities? Yes.    Do you have any questions about your discharge instructions: Diet   No. Medications  No. Follow up visit  No.  Do you have questions or concerns about your Care? No.  Actions: * If pain score is 4 or above: No action needed, pain <4.

## 2022-05-01 ENCOUNTER — Encounter: Payer: Self-pay | Admitting: Internal Medicine

## 2022-05-16 ENCOUNTER — Other Ambulatory Visit: Payer: Self-pay | Admitting: Family Medicine

## 2022-05-16 DIAGNOSIS — Z7901 Long term (current) use of anticoagulants: Secondary | ICD-10-CM

## 2022-05-16 DIAGNOSIS — Z86718 Personal history of other venous thrombosis and embolism: Secondary | ICD-10-CM

## 2022-07-13 IMAGING — CT CT ABD-PELV W/ CM
2 of 5 series · 16 of 46 positions shown, 18 images · IV contrast (OMNIPAQUE 300)
Comparison: Noncontrast CT on 04/05/2020

CLINICAL DATA: Gross hematuria.

EXAM:
CT ABDOMEN AND PELVIS WITH CONTRAST
TECHNIQUE: Multidetector CT imaging of the abdomen and pelvis was performed
using the standard protocol following bolus administration of
intravenous contrast.
CONTRAST:  100mL OMNIPAQUE IOHEXOL 300 MG/ML  SOLN

[Series 2: abd/pel w · axial · 0.70mm/px · z∈[-532,-112]mm · 13 of 94 slices shown, 15 images]
[im 5/94  soft-tissue]
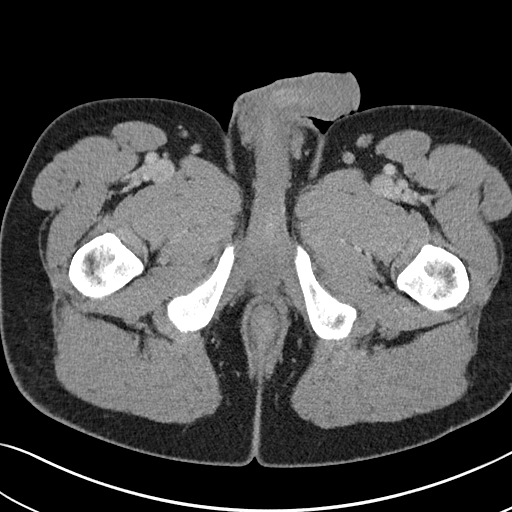
[im 5/94  bone]
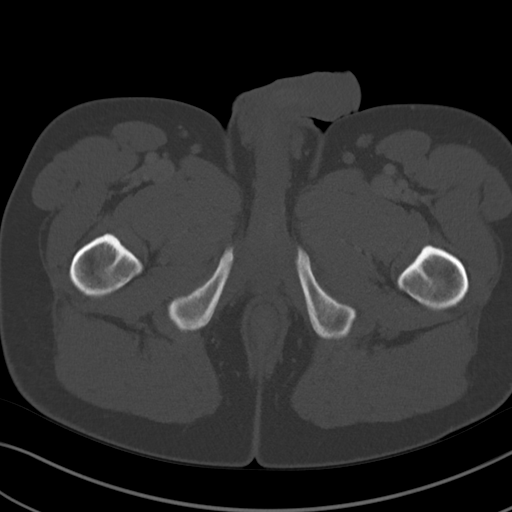
[im 15/94  soft-tissue]
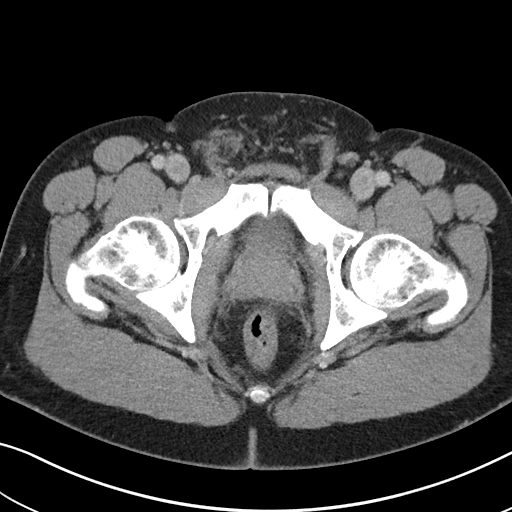
[im 20/94  soft-tissue]
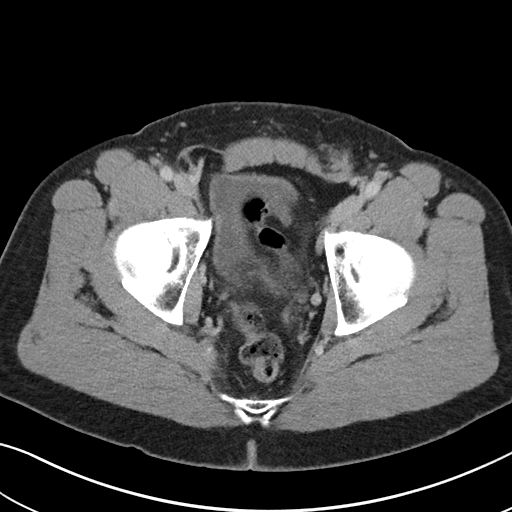
[im 25/94  soft-tissue]
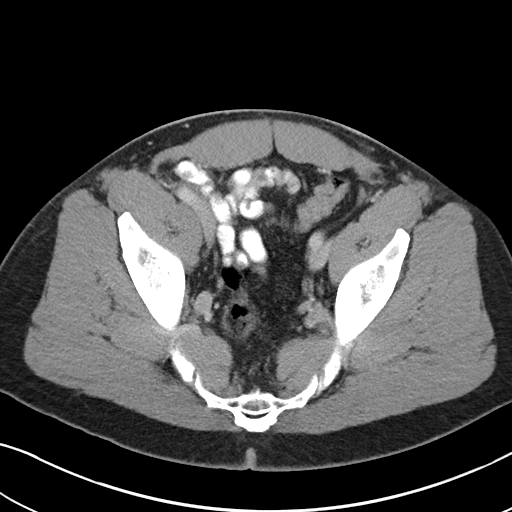
[im 35/94  soft-tissue]
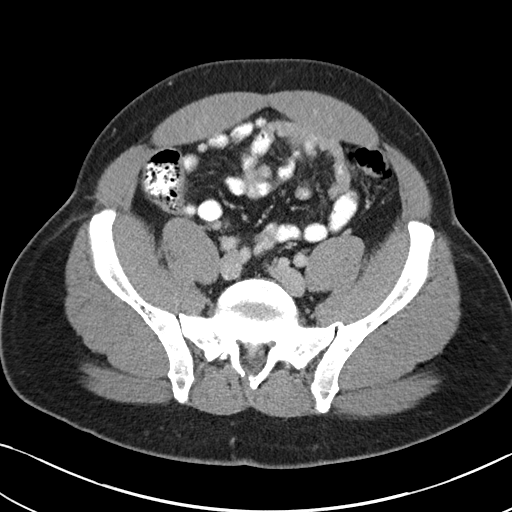
[im 40/94  soft-tissue]
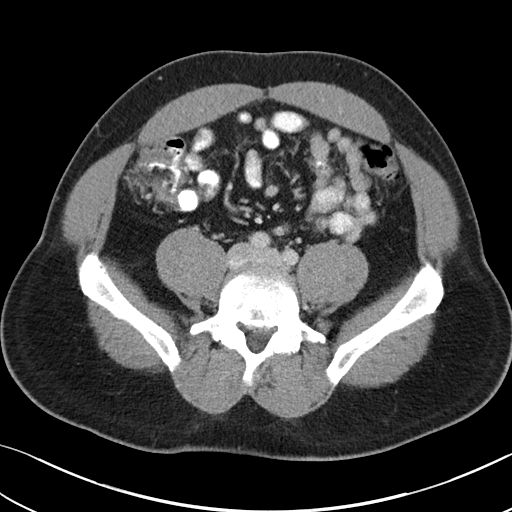
[im 49/94  soft-tissue]
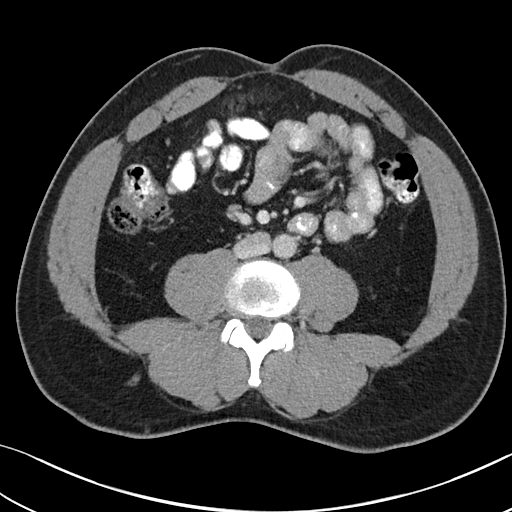
[im 54/94  soft-tissue]
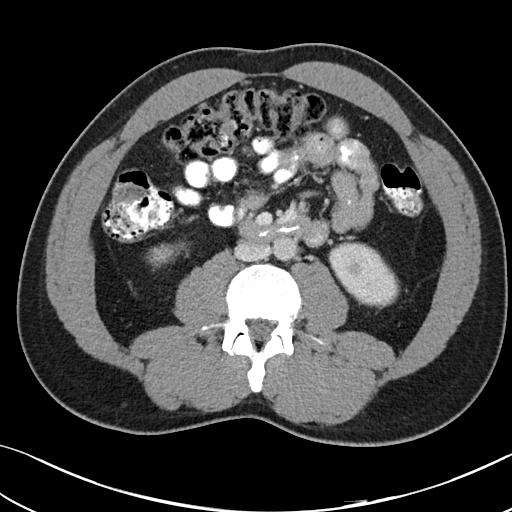
[im 59/94  soft-tissue]
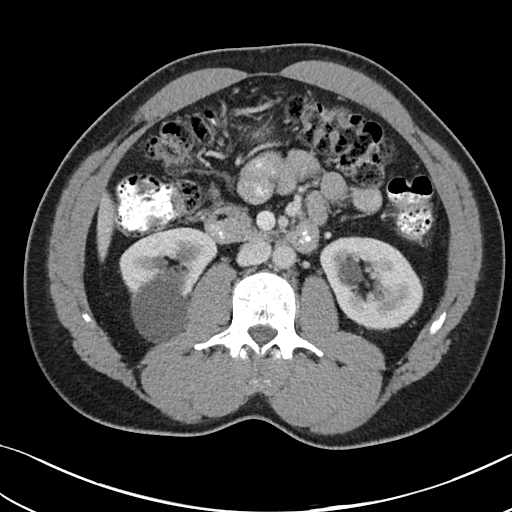
[im 59/94  bone]
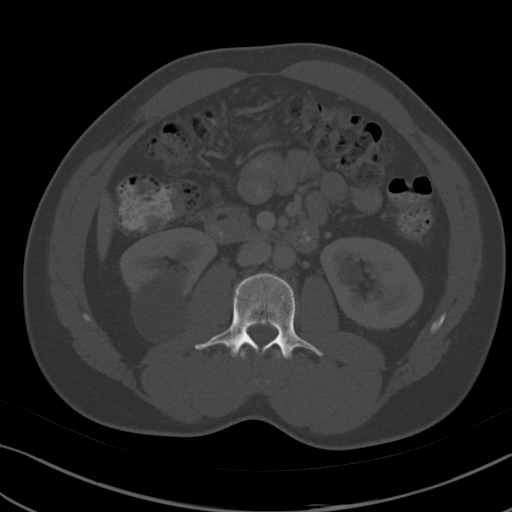
[im 69/94  soft-tissue]
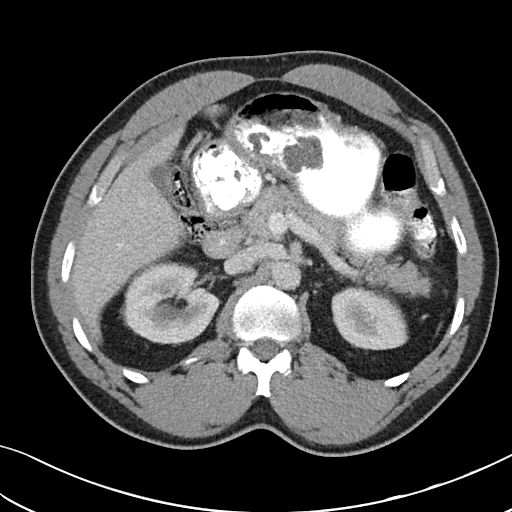
[im 74/94  soft-tissue]
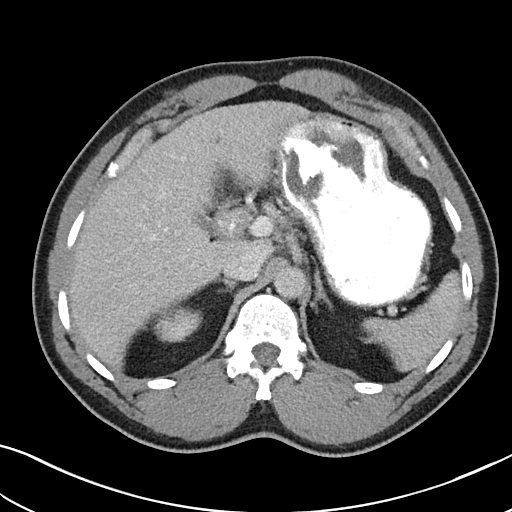
[im 79/94  soft-tissue]
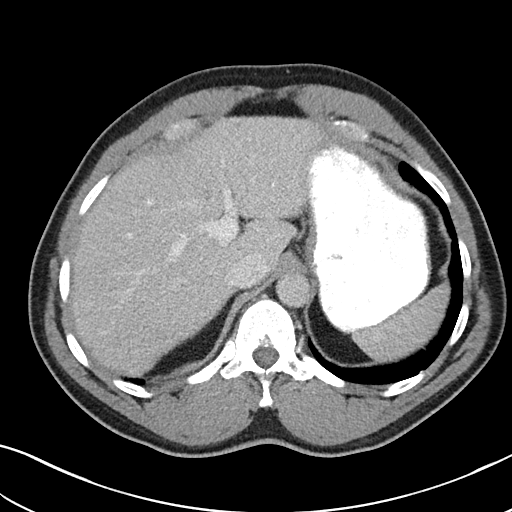
[im 89/94  soft-tissue]
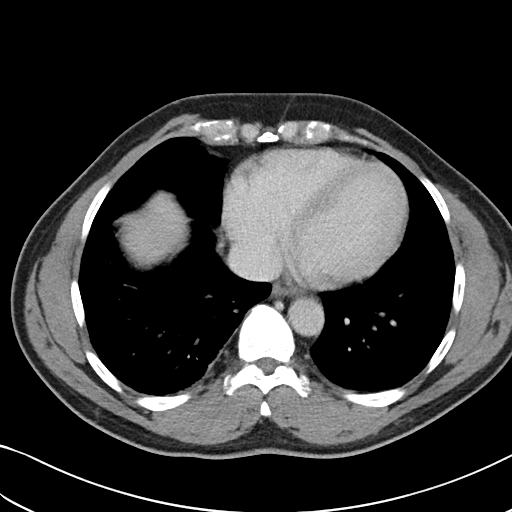

[Series 5: coronal st · coronal · 0.78mm/px · 3 of 101 slices shown]
[im 34/101  soft-tissue]
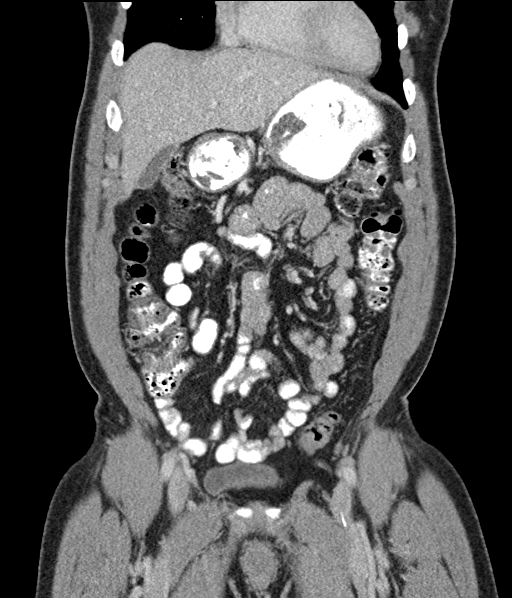
[im 45/101  soft-tissue]
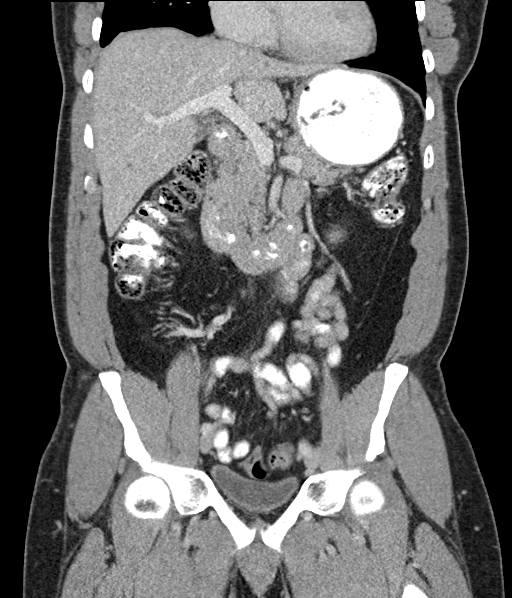
[im 56/101  soft-tissue]
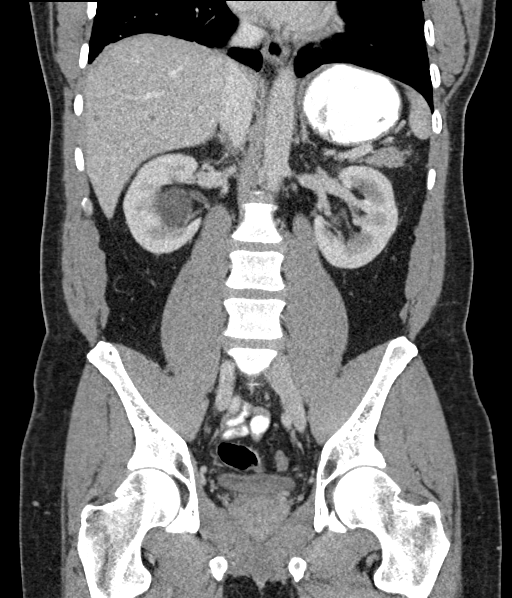

[16 of 46 positions shown; findings below may reference images not displayed]

FINDINGS: Lower Chest: No acute findings.

Hepatobiliary: Multiple tiny sub-cm cysts are noted in both the
right and left lobes. At least 1 tiny less than 5 mm calcified
gallstone is noted, however there is no evidence of cholecystitis or
biliary ductal dilatation. No hepatic masses identified.

Pancreas:  No mass or inflammatory changes.

Spleen: Within normal limits in size and appearance.

Adrenals/Urinary Tract: A few benign-appearing cysts are again seen
in both kidneys, largest in the posterior right kidney measuring 5
cm. No masses identified. No evidence of ureteral calculi or
hydronephrosis. Unremarkable unopacified urinary bladder.

Stomach/Bowel: No evidence of obstruction, inflammatory process or
abnormal fluid collections.

Vascular/Lymphatic: No pathologically enlarged lymph nodes. No acute
vascular findings.

Reproductive:  No mass or other significant abnormality.

Other:  None.

Musculoskeletal:  No suspicious bone lesions identified.
IMPRESSION: Bilateral benign-appearing renal cysts. No radiographic evidence of
renal neoplasm, calculi, or hydronephrosis.

Cholelithiasis. No radiographic evidence of cholecystitis.

## 2022-10-21 ENCOUNTER — Encounter: Payer: Self-pay | Admitting: Emergency Medicine

## 2022-10-21 ENCOUNTER — Ambulatory Visit: Payer: Federal, State, Local not specified - PPO | Admitting: Emergency Medicine

## 2022-10-21 VITALS — BP 126/88 | HR 64 | Temp 97.7°F | Ht 71.0 in | Wt 224.5 lb

## 2022-10-21 DIAGNOSIS — J069 Acute upper respiratory infection, unspecified: Secondary | ICD-10-CM | POA: Diagnosis not present

## 2022-10-21 DIAGNOSIS — Z7901 Long term (current) use of anticoagulants: Secondary | ICD-10-CM

## 2022-10-21 DIAGNOSIS — Z23 Encounter for immunization: Secondary | ICD-10-CM | POA: Diagnosis not present

## 2022-10-21 DIAGNOSIS — I82409 Acute embolism and thrombosis of unspecified deep veins of unspecified lower extremity: Secondary | ICD-10-CM | POA: Diagnosis not present

## 2022-10-21 DIAGNOSIS — E785 Hyperlipidemia, unspecified: Secondary | ICD-10-CM

## 2022-10-21 NOTE — Assessment & Plan Note (Signed)
No significant clinical bleeding episodes Tolerating Xarelto well. Continue Xarelto 20 mg daily.

## 2022-10-21 NOTE — Assessment & Plan Note (Signed)
Clinically stable and running its course without complications. Today much better than 1 week ago. Continue over-the-counter Mucinex DM as needed Advised to stay well-hydrated and contact the office if no better or worse during the next several days. No concerns identified today

## 2022-10-21 NOTE — Assessment & Plan Note (Signed)
Clinically stable Continues long-term use of Xarelto 20 mg daily

## 2022-10-21 NOTE — Patient Instructions (Signed)

## 2022-10-21 NOTE — Progress Notes (Signed)
Ryan Gutierrez 62 y.o.   Chief Complaint  Patient presents with   Cough    Patient states he is coughing up mucus, congestion, slight fever off and on .   Covid neg     HISTORY OF PRESENT ILLNESS: Acute problem visit today. This is a 62 y.o. male complaining of flulike symptoms that started 1 week ago and are now much improved. Tested negative for COVID.  No complications. No other comp's or medical concerns today.  Cough Pertinent negatives include no chest pain, chills, fever, headaches, rash, shortness of breath or wheezing.     Prior to Admission medications   Medication Sig Start Date End Date Taking? Authorizing Provider  atorvastatin (LIPITOR) 40 MG tablet TAKE 1 TABLET(40 MG) BY MOUTH DAILY 01/18/22  Yes Andris Brothers, Eilleen Kempf, MD  rivaroxaban (XARELTO) 20 MG TABS tablet Take 1 tablet (20 mg total) by mouth daily with supper. 01/18/22  Yes Georgina Quint, MD    No Known Allergies  Patient Active Problem List   Diagnosis Date Noted   Viral upper respiratory infection 10/21/2022   Dyslipidemia 11/06/2021   Mixed hyperlipidemia 05/26/2013   Long term (current) use of anticoagulants 05/26/2013   Recurrent deep vein thrombosis (HCC) 10/24/2011    Past Medical History:  Diagnosis Date   Clotting disorder (HCC)    DVT (deep venous thrombosis) (HCC)    post-knee surgery   Hyperlipidemia    Sickle cell trait (HCC)     Past Surgical History:  Procedure Laterality Date   COLONOSCOPY     HERNIA REPAIR     KNEE SURGERY  1990s    Social History   Socioeconomic History   Marital status: Married    Spouse name: Not on file   Number of children: 2   Years of education: Not on file   Highest education level: Not on file  Occupational History   Not on file  Tobacco Use   Smoking status: Never   Smokeless tobacco: Never  Vaping Use   Vaping status: Never Used  Substance and Sexual Activity   Alcohol use: Yes    Alcohol/week: 0.0 standard drinks of alcohol     Comment: occ   Drug use: No   Sexual activity: Yes  Other Topics Concern   Not on file  Social History Narrative   Not on file   Social Determinants of Health   Financial Resource Strain: Low Risk  (12/14/2018)   Overall Financial Resource Strain (CARDIA)    Difficulty of Paying Living Expenses: Not hard at all  Food Insecurity: No Food Insecurity (12/14/2018)   Hunger Vital Sign    Worried About Running Out of Food in the Last Year: Never true    Ran Out of Food in the Last Year: Never true  Transportation Needs: No Transportation Needs (12/14/2018)   PRAPARE - Administrator, Civil Service (Medical): No    Lack of Transportation (Non-Medical): No  Physical Activity: Insufficiently Active (12/14/2018)   Exercise Vital Sign    Days of Exercise per Week: 3 days    Minutes of Exercise per Session: 30 min  Stress: No Stress Concern Present (12/14/2018)   [employer] of Occupational Health - Occupational Stress Questionnaire    Feeling of Stress : Only a little  Social Connections: Unknown (12/14/2018)   Social Connection and Isolation Panel [NHANES]    Frequency of Communication with Friends and Family: Three times a week    Frequency of Social Gatherings with  Friends and Family: Twice a week    Attends Religious Services: Patient declined    Active Member of Clubs or Organizations: Patient declined    Attends Banker Meetings: Patient declined    Marital Status: Patient declined  Intimate Partner Violence: Not At Risk (12/14/2018)   Humiliation, Afraid, Rape, and Kick questionnaire    Fear of Current or Ex-Partner: No    Emotionally Abused: No    Physically Abused: No    Sexually Abused: No    Family History  Problem Relation Age of Onset   Heart disease Mother    Heart attack Mother    Diabetes Sister    Hypertension Sister    Sickle cell anemia Son    Colon cancer Neg Hx    Esophageal cancer Neg Hx    Stomach cancer Neg Hx    Rectal  cancer Neg Hx      Review of Systems  Constitutional: Negative.  Negative for chills and fever.  HENT:  Positive for congestion.   Respiratory:  Positive for cough and sputum production. Negative for shortness of breath and wheezing.   Cardiovascular: Negative.  Negative for chest pain and palpitations.  Gastrointestinal:  Negative for abdominal pain, diarrhea, nausea and vomiting.  Genitourinary: Negative.  Negative for dysuria and hematuria.  Skin: Negative.  Negative for rash.  Neurological: Negative.  Negative for dizziness and headaches.    Vitals:   10/21/22 1442  BP: 126/88  Pulse: 64  Temp: 97.7 F (36.5 C)  SpO2: 95%    Physical Exam Vitals reviewed.  Constitutional:      Appearance: Normal appearance.  HENT:     Head: Normocephalic.     Right Ear: Tympanic membrane, ear canal and external ear normal.     Left Ear: Tympanic membrane, ear canal and external ear normal.     Mouth/Throat:     Mouth: Mucous membranes are moist.     Pharynx: Oropharynx is clear.  Eyes:     Extraocular Movements: Extraocular movements intact.     Conjunctiva/sclera: Conjunctivae normal.     Pupils: Pupils are equal, round, and reactive to light.  Cardiovascular:     Rate and Rhythm: Normal rate and regular rhythm.     Pulses: Normal pulses.     Heart sounds: Normal heart sounds.  Pulmonary:     Effort: Pulmonary effort is normal.     Breath sounds: Normal breath sounds.  Abdominal:     Palpations: Abdomen is soft.     Tenderness: There is no abdominal tenderness.  Musculoskeletal:     Cervical back: No tenderness.  Lymphadenopathy:     Cervical: No cervical adenopathy.  Skin:    General: Skin is warm and dry.     Capillary Refill: Capillary refill takes less than 2 seconds.  Neurological:     General: No focal deficit present.     Mental Status: He is alert and oriented to person, place, and time.  Psychiatric:        Mood and Affect: Mood normal.        Behavior:  Behavior normal.      ASSESSMENT & PLAN: Problem List Items Addressed This Visit       Cardiovascular and Mediastinum   Recurrent deep vein thrombosis (HCC)    Clinically stable Continues long-term use of Xarelto 20 mg daily        Respiratory   Viral upper respiratory infection - Primary    Clinically stable and  running its course without complications. Today much better than 1 week ago. Continue over-the-counter Mucinex DM as needed Advised to stay well-hydrated and contact the office if no better or worse during the next several days. No concerns identified today        Other   Long term (current) use of anticoagulants    No significant clinical bleeding episodes Tolerating Xarelto well. Continue Xarelto 20 mg daily.      Dyslipidemia    Chronic stable condition Continue atorvastatin 40 mg daily.      Other Visit Diagnoses     Need for vaccination       Relevant Orders   Flu vaccine trivalent PF, 6mos and older(Flulaval,Afluria,Fluarix,Fluzone)      Patient Instructions  Viral Respiratory Infection A viral respiratory infection is an illness that affects parts of the body that are used for breathing. These include the lungs, nose, and throat. It is caused by a germ called a virus. Some examples of this kind of infection are: A cold. The flu (influenza). A respiratory syncytial virus (RSV) infection. What are the causes? This condition is caused by a virus. It spreads from person to person. You can get the virus if: You breathe in droplets from someone who is sick. You come in contact with people who are sick. You touch mucus or other fluid from a person who is sick. What are the signs or symptoms? Symptoms of this condition include: A stuffy or runny nose. A sore throat. A cough. Shortness of breath. Trouble breathing. Yellow or green fluid in the nose. Other symptoms may include: A fever. Sweating or chills. Tiredness (fatigue). Achy  muscles. A headache. How is this treated? This condition may be treated with: Medicines that treat viruses. Medicines that make it easy to breathe. Medicines that are sprayed into the nose. Acetaminophen or NSAIDs, such as ibuprofen, to treat fever. Follow these instructions at home: Managing pain and congestion Take over-the-counter and prescription medicines only as told by your doctor. If you have a sore throat, gargle with salt water. Do this 3-4 times a day or as needed. To make salt water, dissolve -1 tsp (3-6 g) of salt in 1 cup (237 mL) of warm water. Make sure that all the salt dissolves. Use nose drops made from salt water. This helps with stuffiness (congestion). It also helps soften the skin around your nose. Take 2 tsp (10 mL) of honey at bedtime to lessen coughing at night. Do not give honey to children who are younger than 21 year old. Drink enough fluid to keep your pee (urine) pale yellow. General instructions  Rest as much as possible. Do not drink alcohol. Do not smoke or use any products that contain nicotine or tobacco. If you need help quitting, ask your doctor. Keep all follow-up visits. How is this prevented?     Get a flu shot every year. Ask your doctor when you should get your flu shot. Do not let other people get your germs. If you are sick: Wash your hands with soap and water often. Wash your hands after you cough or sneeze. Wash hands for at least 20 seconds. If you cannot use soap and water, use hand sanitizer. Cover your mouth when you cough. Cover your nose and mouth when you sneeze. Do not share cups or eating utensils. Clean commonly used objects often. Clean commonly touched surfaces. Stay home from work or school. Avoid contact with people who are sick during cold and flu season.  This is in fall and winter. Get help if: Your symptoms last for 10 days or longer. Your symptoms get worse over time. You have very bad pain in your face or  forehead. Parts of your jaw or neck get very swollen. You have shortness of breath. Get help right away if: You feel pain or pressure in your chest. You have trouble breathing. You faint or feel like you will faint. You keep vomiting and it gets worse. You feel confused. These symptoms may be an emergency. Get help right away. Call your local emergency services (911 in the [place] Do not wait to see if the symptoms will go away. Do not drive yourself to the hospital. Summary A viral respiratory infection is an illness that affects parts of the body that are used for breathing. Examples of this illness include a cold, the flu, and a respiratory syncytial virus (RSV) infection. The infection can cause a runny nose, cough, sore throat, and fever. Follow what your doctor tells you about taking medicines, drinking lots of fluid, washing your hands, resting at home, and avoiding people who are sick. This information is not intended to replace advice given to you by your health care provider. Make sure you discuss any questions you have with your health care provider. Document Revised: 03/29/2020 Document Reviewed: 03/29/2020 Elsevier Patient Education  2024 Elsevier Inc.    Edwina Barth, MD [facility]

## 2022-10-21 NOTE — Assessment & Plan Note (Signed)
Chronic stable condition Continue atorvastatin 40 mg daily

## 2022-11-11 ENCOUNTER — Ambulatory Visit: Payer: Federal, State, Local not specified - PPO

## 2022-11-11 ENCOUNTER — Encounter: Payer: Self-pay | Admitting: Emergency Medicine

## 2022-11-11 ENCOUNTER — Ambulatory Visit: Payer: Federal, State, Local not specified - PPO | Admitting: Emergency Medicine

## 2022-11-11 VITALS — BP 138/84 | HR 71 | Temp 97.9°F | Ht 71.0 in | Wt 219.5 lb

## 2022-11-11 DIAGNOSIS — R053 Chronic cough: Secondary | ICD-10-CM | POA: Diagnosis not present

## 2022-11-11 DIAGNOSIS — R918 Other nonspecific abnormal finding of lung field: Secondary | ICD-10-CM | POA: Diagnosis not present

## 2022-11-11 DIAGNOSIS — R059 Cough, unspecified: Secondary | ICD-10-CM | POA: Diagnosis not present

## 2022-11-11 NOTE — Progress Notes (Signed)
Ryan Gutierrez 62 y.o.   Chief Complaint  Patient presents with   Cough    Patient states the cough he had is not going away. Off and on . Chest congestion x 3 weeks     HISTORY OF PRESENT ILLNESS: This is a 62 y.o. male complaining of persistent lingering cough for the past 3 to 4 weeks. Denies difficulty breathing or wheezing.  Denies chest pain.  No history of asthma.  Non-smoker. Occasional white phlegm.  No fever or chills.  No other associated symptoms. No other complaints or medical concerns today.  Cough Pertinent negatives include no chest pain, chills, fever, headaches, hemoptysis, sore throat, shortness of breath or wheezing.     Prior to Admission medications   Medication Sig Start Date End Date Taking? Authorizing Provider  atorvastatin (LIPITOR) 40 MG tablet TAKE 1 TABLET(40 MG) BY MOUTH DAILY 01/18/22  Yes Nahsir Venezia, Eilleen Kempf, MD  rivaroxaban (XARELTO) 20 MG TABS tablet Take 1 tablet (20 mg total) by mouth daily with supper. 01/18/22  Yes Georgina Quint, MD    No Known Allergies  Patient Active Problem List   Diagnosis Date Noted   Viral upper respiratory infection 10/21/2022   Dyslipidemia 11/06/2021   Mixed hyperlipidemia 05/26/2013   Long term (current) use of anticoagulants 05/26/2013   Recurrent deep vein thrombosis (HCC) 10/24/2011    Past Medical History:  Diagnosis Date   Clotting disorder (HCC)    DVT (deep venous thrombosis) (HCC)    post-knee surgery   Hyperlipidemia    Sickle cell trait (HCC)     Past Surgical History:  Procedure Laterality Date   COLONOSCOPY     HERNIA REPAIR     KNEE SURGERY  1990s    Social History   Socioeconomic History   Marital status: Married    Spouse name: Not on file   Number of children: 2   Years of education: Not on file   Highest education level: Not on file  Occupational History   Not on file  Tobacco Use   Smoking status: Never   Smokeless tobacco: Never  Vaping Use   Vaping status:  Never Used  Substance and Sexual Activity   Alcohol use: Yes    Alcohol/week: 0.0 standard drinks of alcohol    Comment: occ   Drug use: No   Sexual activity: Yes  Other Topics Concern   Not on file  Social History Narrative   Not on file   Social Determinants of Health   Financial Resource Strain: Low Risk  (12/14/2018)   Overall Financial Resource Strain (CARDIA)    Difficulty of Paying Living Expenses: Not hard at all  Food Insecurity: No Food Insecurity (12/14/2018)   Hunger Vital Sign    Worried About Running Out of Food in the Last Year: Never true    Ran Out of Food in the Last Year: Never true  Transportation Needs: No Transportation Needs (12/14/2018)   PRAPARE - Administrator, Civil Service (Medical): No    Lack of Transportation (Non-Medical): No  Physical Activity: Insufficiently Active (12/14/2018)   Exercise Vital Sign    Days of Exercise per Week: 3 days    Minutes of Exercise per Session: 30 min  Stress: No Stress Concern Present (12/14/2018)   [employer] of Occupational Health - Occupational Stress Questionnaire    Feeling of Stress : Only a little  Social Connections: Unknown (12/14/2018)   Social Connection and Isolation Panel [NHANES]  Frequency of Communication with Friends and Family: Three times a week    Frequency of Social Gatherings with Friends and Family: Twice a week    Attends Religious Services: Patient declined    Database administrator or Organizations: Patient declined    Attends Banker Meetings: Patient declined    Marital Status: Patient declined  Intimate Partner Violence: Not At Risk (12/14/2018)   Humiliation, Afraid, Rape, and Kick questionnaire    Fear of Current or Ex-Partner: No    Emotionally Abused: No    Physically Abused: No    Sexually Abused: No    Family History  Problem Relation Age of Onset   Heart disease Mother    Heart attack Mother    Diabetes Sister    Hypertension Sister     Sickle cell anemia Son    Colon cancer Neg Hx    Esophageal cancer Neg Hx    Stomach cancer Neg Hx    Rectal cancer Neg Hx      Review of Systems  Constitutional: Negative.  Negative for chills and fever.  HENT: Negative.  Negative for congestion and sore throat.   Respiratory:  Positive for cough. Negative for hemoptysis, sputum production, shortness of breath and wheezing.   Cardiovascular: Negative.  Negative for chest pain and palpitations.  Gastrointestinal:  Negative for abdominal pain, diarrhea, nausea and vomiting.  Genitourinary: Negative.  Negative for dysuria and hematuria.  Neurological: Negative.  Negative for dizziness and headaches.  All other systems reviewed and are negative.   Vitals:   11/11/22 1553  BP: 138/84  Pulse: 71  Temp: 97.9 F (36.6 C)  SpO2: 94%    Physical Exam Vitals reviewed.  Constitutional:      Appearance: Normal appearance.  HENT:     Head: Normocephalic.     Mouth/Throat:     Mouth: Mucous membranes are moist.     Pharynx: Oropharynx is clear.  Eyes:     Extraocular Movements: Extraocular movements intact.     Pupils: Pupils are equal, round, and reactive to light.  Cardiovascular:     Rate and Rhythm: Normal rate and regular rhythm.     Pulses: Normal pulses.     Heart sounds: Normal heart sounds.  Pulmonary:     Effort: Pulmonary effort is normal.     Breath sounds: Normal breath sounds.  Musculoskeletal:     Cervical back: No tenderness.  Lymphadenopathy:     Cervical: No cervical adenopathy.  Skin:    General: Skin is warm and dry.     Capillary Refill: Capillary refill takes less than 2 seconds.  Neurological:     General: No focal deficit present.     Mental Status: He is alert and oriented to person, place, and time.  Psychiatric:        Mood and Affect: Mood normal.        Behavior: Behavior normal.      ASSESSMENT & PLAN: A total of 33 minutes was spent with the patient and counseling/coordination of care  regarding preparing for this visit, review of most recent office visit notes, review of chronic medical conditions under management, review of all medications, differential diagnosis of persistent cough, review of chest x-ray done today, cough management, prognosis, documentation, and need for follow-up if no better or worse during the next several days or weeks.  Problem List Items Addressed This Visit       Other   Persistent cough - Primary  Clinically stable.  No red flag signs or symptoms. Benign physical examination.  Afebrile. Clear lungs.  Non-smoker.  No history of asthma.  No wheezing on examination.  Not taking ACE inhibitor. Differential diagnosis discussed Chest x-ray done today.  Report reviewed. Recommend over-the-counter Mucinex DM as needed Advised to contact the office if no better or worse during the next several days or weeks.      Relevant Orders   DG Chest 2 View   Patient Instructions  Cough, Adult A cough helps to clear your throat and lungs. It may be a sign of an illness or another condition. A short-term (acute) cough may last 2-3 weeks. A long-term (chronic) cough may last 8 or more weeks. Many things can cause a cough. They include: Illnesses such as: An infection in your throat or lungs. Asthma or other heart or lung problems. Gastroesophageal reflux. This is when acid comes back up from your stomach. Breathing in things that bother (irritate) your lungs. Allergies. Postnasal drip. This is when mucus runs down the back of your throat. Smoking. Some medicines. Follow these instructions at home: Medicines Take over-the-counter and prescription medicines only as told by your doctor. Talk with your doctor before you take cough medicine (cough suppressants). Eating and drinking Do not drink alcohol. Do not drink caffeine. Drink enough fluid to keep your pee (urine) pale yellow. Lifestyle Stay away from cigarette smoke. Do not smoke or use any  products that contain nicotine or tobacco. If you need help quitting, ask your doctor. Stay away from things that make you cough. These may include perfume, candles, cleaning products, or campfire smoke. General instructions  Watch for any changes to your cough. Tell your doctor about them. Always cover your mouth when you cough. If the air is dry in your home, use a cool mist vaporizer or humidifier. If your cough is worse at night, try using extra pillows to raise your head up higher while you sleep. Rest as needed. Contact a doctor if: You have new symptoms. Your symptoms get worse. You cough up pus. You have a fever that does not go away. Your cough does not get better after 2-3 weeks. Cough medicine does not help, and you are not sleeping well. You have pain that gets worse or is not helped with medicine. You are losing weight and do not know why. You have night sweats. Get help right away if: You cough up blood. You have trouble breathing. Your heart is beating very fast. These symptoms may be an emergency. Get help right away. Call 911. Do not wait to see if the symptoms will go away. Do not drive yourself to the hospital. This information is not intended to replace advice given to you by your health care provider. Make sure you discuss any questions you have with your health care provider. Document Revised: 08/23/2021 Document Reviewed: 08/23/2021 Elsevier Patient Education  2024 Elsevier Inc.    Edwina Barth, MD [facility]

## 2022-11-11 NOTE — Assessment & Plan Note (Addendum)
Clinically stable.  No red flag signs or symptoms. Benign physical examination.  Afebrile. Clear lungs.  Non-smoker.  No history of asthma.  No wheezing on examination.  Not taking ACE inhibitor. Differential diagnosis discussed Chest x-ray done today.  Report reviewed. Recommend over-the-counter Mucinex DM as needed Advised to contact the office if no better or worse during the next several days or weeks.

## 2022-11-11 NOTE — Patient Instructions (Signed)

## 2023-01-16 ENCOUNTER — Other Ambulatory Visit: Payer: Self-pay | Admitting: Emergency Medicine

## 2023-01-16 DIAGNOSIS — E785 Hyperlipidemia, unspecified: Secondary | ICD-10-CM

## 2023-01-19 NOTE — Telephone Encounter (Signed)
 Copied from CRM [id number]. Topic: Clinical - Medication Refill >> Jan 16, 2023 12:26 PM Viola FALCON wrote: Most Recent Primary Care Visit:  Provider: PURCELL EMIL SCHANZ  Department: [facility]  Visit Type: SAME DAY  Date: 11/11/2022  Medication: atorvastatin  (LIPITOR) 40 MG tablet  Has the patient contacted their pharmacy? Yes (Agent: If no, request that the patient contact the pharmacy for the refill. If patient does not wish to contact the pharmacy document the reason why and proceed with request.) (Agent: If yes, when and what did the pharmacy advise?)  Is this the correct pharmacy for this prescription? Yes If no, delete pharmacy and type the correct one.  This is the patient's preferred pharmacy:   [id number] [place], [place] - [address] AT [facility] OF [facility] [address] [place] [address] Phone: [phone] Fax: [phone]   Has the prescription been filled recently? Yes  Is the patient out of the medication? Yes, patient has 1 pill   Has the patient been seen for an appointment in the last year OR does the patient have an upcoming appointment? Yes  Can we respond through MyChart? Yes  Agent: Please be advised that Rx refills may take up to 3 business days. We ask that you follow-up with your pharmacy.

## 2023-02-09 ENCOUNTER — Encounter: Payer: Self-pay | Admitting: Emergency Medicine

## 2023-02-09 ENCOUNTER — Ambulatory Visit (INDEPENDENT_AMBULATORY_CARE_PROVIDER_SITE_OTHER): Payer: Federal, State, Local not specified - PPO | Admitting: Emergency Medicine

## 2023-02-09 VITALS — BP 124/82 | HR 63 | Temp 98.2°F | Ht 71.0 in | Wt 227.0 lb

## 2023-02-09 DIAGNOSIS — Z130 Encounter for screening for diseases of the blood and blood-forming organs and certain disorders involving the immune mechanism: Secondary | ICD-10-CM

## 2023-02-09 DIAGNOSIS — Z1329 Encounter for screening for other suspected endocrine disorder: Secondary | ICD-10-CM

## 2023-02-09 DIAGNOSIS — Z0000 Encounter for general adult medical examination without abnormal findings: Secondary | ICD-10-CM

## 2023-02-09 DIAGNOSIS — Z125 Encounter for screening for malignant neoplasm of prostate: Secondary | ICD-10-CM | POA: Diagnosis not present

## 2023-02-09 DIAGNOSIS — E785 Hyperlipidemia, unspecified: Secondary | ICD-10-CM

## 2023-02-09 DIAGNOSIS — I82409 Acute embolism and thrombosis of unspecified deep veins of unspecified lower extremity: Secondary | ICD-10-CM

## 2023-02-09 DIAGNOSIS — K648 Other hemorrhoids: Secondary | ICD-10-CM | POA: Diagnosis not present

## 2023-02-09 DIAGNOSIS — Z13228 Encounter for screening for other metabolic disorders: Secondary | ICD-10-CM

## 2023-02-09 DIAGNOSIS — Z7901 Long term (current) use of anticoagulants: Secondary | ICD-10-CM

## 2023-02-09 DIAGNOSIS — K625 Hemorrhage of anus and rectum: Secondary | ICD-10-CM | POA: Insufficient documentation

## 2023-02-09 DIAGNOSIS — Z0001 Encounter for general adult medical examination with abnormal findings: Secondary | ICD-10-CM

## 2023-02-09 LAB — CBC WITH DIFFERENTIAL/PLATELET
Basophils Absolute: 0 10*3/uL (ref 0.0–0.1)
Basophils Relative: 0.4 % (ref 0.0–3.0)
Eosinophils Absolute: 0.1 10*3/uL (ref 0.0–0.7)
Eosinophils Relative: 2.3 % (ref 0.0–5.0)
HCT: 45.6 % (ref 39.0–52.0)
Hemoglobin: 15.1 g/dL (ref 13.0–17.0)
Lymphocytes Relative: 25.2 % (ref 12.0–46.0)
Lymphs Abs: 1.3 10*3/uL (ref 0.7–4.0)
MCHC: 33.2 g/dL (ref 30.0–36.0)
MCV: 90.6 fL (ref 78.0–100.0)
Monocytes Absolute: 0.5 10*3/uL (ref 0.1–1.0)
Monocytes Relative: 9.5 % (ref 3.0–12.0)
Neutro Abs: 3.1 10*3/uL (ref 1.4–7.7)
Neutrophils Relative %: 62.6 % (ref 43.0–77.0)
Platelets: 171 10*3/uL (ref 150.0–400.0)
RBC: 5.03 Mil/uL (ref 4.22–5.81)
RDW: 15 % (ref 11.5–15.5)
WBC: 5 10*3/uL (ref 4.0–10.5)

## 2023-02-09 LAB — COMPREHENSIVE METABOLIC PANEL
ALT: 24 U/L (ref 0–53)
AST: 26 U/L (ref 0–37)
Albumin: 4.2 g/dL (ref 3.5–5.2)
Alkaline Phosphatase: 86 U/L (ref 39–117)
BUN: 12 mg/dL (ref 6–23)
CO2: 29 meq/L (ref 19–32)
Calcium: 9.3 mg/dL (ref 8.4–10.5)
Chloride: 103 meq/L (ref 96–112)
Creatinine, Ser: 1.09 mg/dL (ref 0.40–1.50)
GFR: 73.66 mL/min (ref >60.00)
Glucose, Bld: 94 mg/dL (ref 70–99)
Potassium: 4.1 meq/L (ref 3.5–5.1)
Sodium: 139 meq/L (ref 135–145)
Total Bilirubin: 0.7 mg/dL (ref 0.2–1.2)
Total Protein: 7.9 g/dL (ref 6.0–8.3)

## 2023-02-09 LAB — PSA: PSA: 1.58 ng/mL (ref 0.10–4.00)

## 2023-02-09 LAB — HEMOGLOBIN A1C: Hgb A1c MFr Bld: 6 % (ref 4.6–6.5)

## 2023-02-09 LAB — LIPID PANEL
Cholesterol: 156 mg/dL (ref 0–200)
HDL: 47.7 mg/dL (ref >39.00)
LDL Cholesterol: 83 mg/dL (ref 0–99)
NonHDL: 108.12
Total CHOL/HDL Ratio: 3
Triglycerides: 128 mg/dL (ref 0.0–149.0)
VLDL: 25.6 mg/dL (ref 0.0–40.0)

## 2023-02-09 NOTE — Assessment & Plan Note (Signed)
Chronic stable condition Continue atorvastatin 40 mg daily 

## 2023-02-09 NOTE — Assessment & Plan Note (Signed)
Clinically stable.  Most likely secondary to internal hemorrhoids and concurrent use of Xarelto Blood work done today Stable vital signs

## 2023-02-09 NOTE — Assessment & Plan Note (Signed)
Most likely the cause of rectal bleeding On Xarelto 20 mg daily Clinically stable. Blood work done today

## 2023-02-09 NOTE — Progress Notes (Signed)
Ryan Gutierrez 63 y.o.   Chief Complaint  Patient presents with   Annual Exam    HISTORY OF PRESENT ILLNESS: This is a 63 y.o. male here for annual exam On Xarelto.  Has been having intermittent bouts of rectal bleeding Had colonoscopy in 2024 which showed internal hemorrhoids No other complaints or medical concerns today.  HPI   Prior to Admission medications   Medication Sig Start Date End Date Taking? Authorizing Provider  atorvastatin (LIPITOR) 40 MG tablet TAKE 1 TABLET(40 MG) BY MOUTH DAILY 01/16/23  Yes Tristin Vandeusen, Eilleen Kempf, MD  rivaroxaban (XARELTO) 20 MG TABS tablet Take 1 tablet (20 mg total) by mouth daily with supper. 01/18/22  Yes Georgina Quint, MD    No Known Allergies  Patient Active Problem List   Diagnosis Date Noted   Dyslipidemia 11/06/2021   Mixed hyperlipidemia 05/26/2013   Long term (current) use of anticoagulants 05/26/2013   Recurrent deep vein thrombosis (HCC) 10/24/2011    Past Medical History:  Diagnosis Date   Clotting disorder (HCC)    DVT (deep venous thrombosis) (HCC)    post-knee surgery   Hyperlipidemia    Sickle cell trait (HCC)     Past Surgical History:  Procedure Laterality Date   COLONOSCOPY     HERNIA REPAIR     KNEE SURGERY  1990s    Social History   Socioeconomic History   Marital status: Married    Spouse name: Not on file   Number of children: 2   Years of education: Not on file   Highest education level: Not on file  Occupational History   Not on file  Tobacco Use   Smoking status: Never   Smokeless tobacco: Never  Vaping Use   Vaping status: Never Used  Substance and Sexual Activity   Alcohol use: Yes    Alcohol/week: 0.0 standard drinks of alcohol    Comment: occ   Drug use: No   Sexual activity: Yes  Other Topics Concern   Not on file  Social History Narrative   Not on file   Social Drivers of Health   Financial Resource Strain: Low Risk  (12/14/2018)   Overall Financial Resource Strain  (CARDIA)    Difficulty of Paying Living Expenses: Not hard at all  Food Insecurity: No Food Insecurity (12/14/2018)   Hunger Vital Sign    Worried About Running Out of Food in the Last Year: Never true    Ran Out of Food in the Last Year: Never true  Transportation Needs: No Transportation Needs (12/14/2018)   PRAPARE - Administrator, Civil Service (Medical): No    Lack of Transportation (Non-Medical): No  Physical Activity: Insufficiently Active (12/14/2018)   Exercise Vital Sign    Days of Exercise per Week: 3 days    Minutes of Exercise per Session: 30 min  Stress: No Stress Concern Present (12/14/2018)   [employer] of Occupational Health - Occupational Stress Questionnaire    Feeling of Stress : Only a little  Social Connections: Unknown (12/14/2018)   Social Connection and Isolation Panel [NHANES]    Frequency of Communication with Friends and Family: Three times a week    Frequency of Social Gatherings with Friends and Family: Twice a week    Attends Religious Services: Patient declined    Database administrator or Organizations: Patient declined    Attends Banker Meetings: Patient declined    Marital Status: Patient declined  Intimate Partner Violence: Not  At Risk (12/14/2018)   Humiliation, Afraid, Rape, and Kick questionnaire    Fear of Current or Ex-Partner: No    Emotionally Abused: No    Physically Abused: No    Sexually Abused: No    Family History  Problem Relation Age of Onset   Heart disease Mother    Heart attack Mother    Diabetes Sister    Hypertension Sister    Sickle cell anemia Son    Colon cancer Neg Hx    Esophageal cancer Neg Hx    Stomach cancer Neg Hx    Rectal cancer Neg Hx      Review of Systems  Constitutional: Negative.  Negative for chills and fever.  HENT: Negative.  Negative for congestion and sore throat.   Respiratory: Negative.  Negative for cough and shortness of breath.   Cardiovascular: Negative.   Negative for chest pain.  Gastrointestinal:  Positive for blood in stool. Negative for abdominal pain, diarrhea, nausea and vomiting.  Genitourinary: Negative.  Negative for dysuria and hematuria.  Skin: Negative.  Negative for rash.  Neurological: Negative.  Negative for dizziness and headaches.  All other systems reviewed and are negative.   Vitals:   02/09/23 1403  BP: 124/82  Pulse: 63  Temp: 98.2 F (36.8 C)  SpO2: 98%    Physical Exam Vitals reviewed.  Constitutional:      Appearance: Normal appearance.  HENT:     Head: Normocephalic.     Right Ear: Tympanic membrane, ear canal and external ear normal.     Left Ear: Tympanic membrane, ear canal and external ear normal.     Mouth/Throat:     Mouth: Mucous membranes are moist.     Pharynx: Oropharynx is clear.  Eyes:     Extraocular Movements: Extraocular movements intact.     Conjunctiva/sclera: Conjunctivae normal.     Pupils: Pupils are equal, round, and reactive to light.  Cardiovascular:     Rate and Rhythm: Normal rate and regular rhythm.     Pulses: Normal pulses.     Heart sounds: Normal heart sounds.  Pulmonary:     Effort: Pulmonary effort is normal.     Breath sounds: Normal breath sounds.  Abdominal:     Palpations: Abdomen is soft.     Tenderness: There is no abdominal tenderness.  Musculoskeletal:     Cervical back: No tenderness.  Lymphadenopathy:     Cervical: No cervical adenopathy.  Skin:    General: Skin is warm and dry.  Neurological:     Mental Status: He is alert and oriented to person, place, and time.  Psychiatric:        Mood and Affect: Mood normal.        Behavior: Behavior normal.      ASSESSMENT & PLAN: Problem List Items Addressed This Visit       Cardiovascular and Mediastinum   Recurrent deep vein thrombosis (HCC)   Clinically stable Continues long-term use of Xarelto 20 mg daily      Relevant Orders   CBC with Differential/Platelet   Internal hemorrhoids   Most  likely the cause of rectal bleeding On Xarelto 20 mg daily Clinically stable. Blood work done today      Relevant Orders   CBC with Differential/Platelet     Digestive   Rectal bleeding   Clinically stable.  Most likely secondary to internal hemorrhoids and concurrent use of Xarelto Blood work done today Stable vital signs  Relevant Orders   CBC with Differential/Platelet     Other   Long term (current) use of anticoagulants   Fall precautions given Tolerating Xarelto well. Continue Xarelto 20 mg daily.      Dyslipidemia   Chronic stable condition Continue atorvastatin 40 mg daily.      Relevant Orders   Comprehensive metabolic panel   Lipid panel   Other Visit Diagnoses       Encounter for general adult medical examination with abnormal findings    -  Primary   Relevant Orders   CBC with Differential/Platelet   Comprehensive metabolic panel   Hemoglobin A1c   PSA   Lipid panel     Screening for deficiency anemia       Relevant Orders   CBC with Differential/Platelet     Screening for endocrine, metabolic and immunity disorder       Relevant Orders   Comprehensive metabolic panel   Hemoglobin A1c     Screening for prostate cancer       Relevant Orders   PSA      Modifiable risk factors discussed with patient. Anticipatory guidance according to age provided. The following topics were also discussed: Social Determinants of Health Smoking.  Non-smoker Diet and nutrition and how to avoid constipation Benefits of exercise Cancer screening and review of colonoscopy report from last year 2024 Vaccinations review and recommendations Cardiovascular risk assessment The 10-year ASCVD risk score (Arnett DK, et al., 2019) is: 7.3%   Values used to calculate the score:     Age: 32 years     Sex: Male     Is Non-Hispanic African American: No     Diabetic: No     Tobacco smoker: No     Systolic Blood Pressure: 124 mmHg     Is BP treated: No     HDL  Cholesterol: 47.2 mg/dL     Total Cholesterol: 169 mg/dL  Mental health including depression and anxiety Fall and accident prevention  Patient Instructions  Health Maintenance, Male Adopting a healthy lifestyle and getting preventive care are important in promoting health and wellness. Ask your health care provider about: The right schedule for you to have regular tests and exams. Things you can do on your own to prevent diseases and keep yourself healthy. What should I know about diet, weight, and exercise? Eat a healthy diet  Eat a diet that includes plenty of vegetables, fruits, low-fat dairy products, and lean protein. Do not eat a lot of foods that are high in solid fats, added sugars, or sodium. Maintain a healthy weight Body mass index (BMI) is a measurement that can be used to identify possible weight problems. It estimates body fat based on height and weight. Your health care provider can help determine your BMI and help you achieve or maintain a healthy weight. Get regular exercise Get regular exercise. This is one of the most important things you can do for your health. Most adults should: Exercise for at least 150 minutes each week. The exercise should increase your heart rate and make you sweat (moderate-intensity exercise). Do strengthening exercises at least twice a week. This is in addition to the moderate-intensity exercise. Spend less time sitting. Even light physical activity can be beneficial. Watch cholesterol and blood lipids Have your blood tested for lipids and cholesterol at 61 years of age, then have this test every 5 years. You may need to have your cholesterol levels checked more often if: Your lipid or  cholesterol levels are high. You are older than 61 years of age. You are at high risk for heart disease. What should I know about cancer screening? Many types of cancers can be detected early and may often be prevented. Depending on your health history and  family history, you may need to have cancer screening at various ages. This may include screening for: Colorectal cancer. Prostate cancer. Skin cancer. Lung cancer. What should I know about heart disease, diabetes, and high blood pressure? Blood pressure and heart disease High blood pressure causes heart disease and increases the risk of stroke. This is more likely to develop in people who have high blood pressure readings or are overweight. Talk with your health care provider about your target blood pressure readings. Have your blood pressure checked: Every 3-5 years if you are 62-89 years of age. Every year if you are 37 years old or older. If you are between the ages of 76 and 56 and are a current or former smoker, ask your health care provider if you should have a one-time screening for abdominal aortic aneurysm (AAA). Diabetes Have regular diabetes screenings. This checks your fasting blood sugar level. Have the screening done: Once every three years after age 59 if you are at a normal weight and have a low risk for diabetes. More often and at a younger age if you are overweight or have a high risk for diabetes. What should I know about preventing infection? Hepatitis B If you have a higher risk for hepatitis B, you should be screened for this virus. Talk with your health care provider to find out if you are at risk for hepatitis B infection. Hepatitis C Blood testing is recommended for: Everyone born from [birth year] through 1963. Anyone with known risk factors for hepatitis C. Sexually transmitted infections (STIs) You should be screened each year for STIs, including gonorrhea and chlamydia, if: You are sexually active and are younger than 61 years of age. You are older than 62 years of age and your health care provider tells you that you are at risk for this type of infection. Your sexual activity has changed since you were last screened, and you are at increased risk for chlamydia or  gonorrhea. Ask your health care provider if you are at risk. Ask your health care provider about whether you are at high risk for HIV. Your health care provider may recommend a prescription medicine to help prevent HIV infection. If you choose to take medicine to prevent HIV, you should first get tested for HIV. You should then be tested every 3 months for as long as you are taking the medicine. Follow these instructions at home: Alcohol use Do not drink alcohol if your health care provider tells you not to drink. If you drink alcohol: Limit how much you have to 0-2 drinks a day. Know how much alcohol is in your drink. In the [place] one drink equals one 12 oz bottle of beer (355 mL), one 5 oz glass of wine (148 mL), or one 1 oz glass of hard liquor (44 mL). Lifestyle Do not use any products that contain nicotine or tobacco. These products include cigarettes, chewing tobacco, and vaping devices, such as e-cigarettes. If you need help quitting, ask your health care provider. Do not use street drugs. Do not share needles. Ask your health care provider for help if you need support or information about quitting drugs. General instructions Schedule regular health, dental, and eye exams. Stay current with your  vaccines. Tell your health care provider if: You often feel depressed. You have ever been abused or do not feel safe at home. Summary Adopting a healthy lifestyle and getting preventive care are important in promoting health and wellness. Follow your health care provider's instructions about healthy diet, exercising, and getting tested or screened for diseases. Follow your health care provider's instructions on monitoring your cholesterol and blood pressure. This information is not intended to replace advice given to you by your health care provider. Make sure you discuss any questions you have with your health care provider. Document Revised: 05/14/2020 Document Reviewed: 05/14/2020 Elsevier  Patient Education  2024 [employer]vier Inc.      Edwina Barth, MD [facility] [place]

## 2023-02-09 NOTE — Assessment & Plan Note (Signed)
Fall precautions given Tolerating Xarelto well. Continue Xarelto 20 mg daily.

## 2023-02-09 NOTE — Patient Instructions (Signed)
 Health Maintenance, Male  Adopting a healthy lifestyle and getting preventive care are important in promoting health and wellness. Ask your health care provider about:  The right schedule for you to have regular tests and exams.  Things you can do on your own to prevent diseases and keep yourself healthy.  What should I know about diet, weight, and exercise?  Eat a healthy diet    Eat a diet that includes plenty of vegetables, fruits, low-fat dairy products, and lean protein.  Do not eat a lot of foods that are high in solid fats, added sugars, or sodium.  Maintain a healthy weight  Body mass index (BMI) is a measurement that can be used to identify possible weight problems. It estimates body fat based on height and weight. Your health care provider can help determine your BMI and help you achieve or maintain a healthy weight.  Get regular exercise  Get regular exercise. This is one of the most important things you can do for your health. Most adults should:  Exercise for at least 150 minutes each week. The exercise should increase your heart rate and make you sweat (moderate-intensity exercise).  Do strengthening exercises at least twice a week. This is in addition to the moderate-intensity exercise.  Spend less time sitting. Even light physical activity can be beneficial.  Watch cholesterol and blood lipids  Have your blood tested for lipids and cholesterol at 61 years of age, then have this test every 5 years.  You may need to have your cholesterol levels checked more often if:  Your lipid or cholesterol levels are high.  You are older than 61 years of age.  You are at high risk for heart disease.  What should I know about cancer screening?  Many types of cancers can be detected early and may often be prevented. Depending on your health history and family history, you may need to have cancer screening at various ages. This may include screening for:  Colorectal cancer.  Prostate cancer.  Skin cancer.  Lung  cancer.  What should I know about heart disease, diabetes, and high blood pressure?  Blood pressure and heart disease  High blood pressure causes heart disease and increases the risk of stroke. This is more likely to develop in people who have high blood pressure readings or are overweight.  Talk with your health care provider about your target blood pressure readings.  Have your blood pressure checked:  Every 3-5 years if you are 1-31 years of age.  Every year if you are 28 years old or older.  If you are between the ages of 29 and 4 and are a current or former smoker, ask your health care provider if you should have a one-time screening for abdominal aortic aneurysm (AAA).  Diabetes  Have regular diabetes screenings. This checks your fasting blood sugar level. Have the screening done:  Once every three years after age 40 if you are at a normal weight and have a low risk for diabetes.  More often and at a younger age if you are overweight or have a high risk for diabetes.  What should I know about preventing infection?  Hepatitis B  If you have a higher risk for hepatitis B, you should be screened for this virus. Talk with your health care provider to find out if you are at risk for hepatitis B infection.  Hepatitis C  Blood testing is recommended for:  Everyone born from [birth year] through 1963.  Anyone  with known risk factors for hepatitis C.  Sexually transmitted infections (STIs)  You should be screened each year for STIs, including gonorrhea and chlamydia, if:  You are sexually active and are younger than 61 years of age.  You are older than 61 years of age and your health care provider tells you that you are at risk for this type of infection.  Your sexual activity has changed since you were last screened, and you are at increased risk for chlamydia or gonorrhea. Ask your health care provider if you are at risk.  Ask your health care provider about whether you are at high risk for HIV. Your health care provider  may recommend a prescription medicine to help prevent HIV infection. If you choose to take medicine to prevent HIV, you should first get tested for HIV. You should then be tested every 3 months for as long as you are taking the medicine.  Follow these instructions at home:  Alcohol use  Do not drink alcohol if your health care provider tells you not to drink.  If you drink alcohol:  Limit how much you have to 0-2 drinks a day.  Know how much alcohol is in your drink. In the [place] one drink equals one 12 oz bottle of beer (355 mL), one 5 oz glass of wine (148 mL), or one 1 oz glass of hard liquor (44 mL).  Lifestyle  Do not use any products that contain nicotine or tobacco. These products include cigarettes, chewing tobacco, and vaping devices, such as e-cigarettes. If you need help quitting, ask your health care provider.  Do not use street drugs.  Do not share needles.  Ask your health care provider for help if you need support or information about quitting drugs.  General instructions  Schedule regular health, dental, and eye exams.  Stay current with your vaccines.  Tell your health care provider if:  You often feel depressed.  You have ever been abused or do not feel safe at home.  Summary  Adopting a healthy lifestyle and getting preventive care are important in promoting health and wellness.  Follow your health care provider's instructions about healthy diet, exercising, and getting tested or screened for diseases.  Follow your health care provider's instructions on monitoring your cholesterol and blood pressure.  This information is not intended to replace advice given to you by your health care provider. Make sure you discuss any questions you have with your health care provider.  Document Revised: 05/14/2020 Document Reviewed: 05/14/2020  Elsevier Patient Education  2024 [employer].

## 2023-02-09 NOTE — Assessment & Plan Note (Signed)
Clinically stable Continues long-term use of Xarelto 20 mg daily

## 2023-02-10 ENCOUNTER — Encounter: Payer: Self-pay | Admitting: Emergency Medicine

## 2023-03-05 ENCOUNTER — Other Ambulatory Visit: Payer: Self-pay | Admitting: Emergency Medicine

## 2023-03-05 DIAGNOSIS — Z86718 Personal history of other venous thrombosis and embolism: Secondary | ICD-10-CM

## 2023-03-05 DIAGNOSIS — Z7901 Long term (current) use of anticoagulants: Secondary | ICD-10-CM

## 2023-03-21 DIAGNOSIS — J329 Chronic sinusitis, unspecified: Secondary | ICD-10-CM | POA: Diagnosis not present

## 2024-02-08 ENCOUNTER — Other Ambulatory Visit: Payer: Self-pay | Admitting: Emergency Medicine

## 2024-02-08 DIAGNOSIS — E785 Hyperlipidemia, unspecified: Secondary | ICD-10-CM

## 2024-02-23 ENCOUNTER — Encounter: Admitting: Emergency Medicine
